# Patient Record
Sex: Female | Born: 1950 | Race: Black or African American | Hispanic: No | Marital: Single | Smoking: Former smoker
Health system: Southern US, Community
[De-identification: ages and names within clinical notes are randomized; demographics above are authoritative.]

## PROBLEM LIST (undated history)

## (undated) DIAGNOSIS — T884XXA Failed or difficult intubation, initial encounter: Secondary | ICD-10-CM

## (undated) DIAGNOSIS — F32A Depression, unspecified: Secondary | ICD-10-CM

## (undated) DIAGNOSIS — J40 Bronchitis, not specified as acute or chronic: Secondary | ICD-10-CM

## (undated) DIAGNOSIS — T7840XA Allergy, unspecified, initial encounter: Secondary | ICD-10-CM

## (undated) DIAGNOSIS — F419 Anxiety disorder, unspecified: Secondary | ICD-10-CM

## (undated) DIAGNOSIS — R0600 Dyspnea, unspecified: Secondary | ICD-10-CM

## (undated) DIAGNOSIS — R011 Cardiac murmur, unspecified: Secondary | ICD-10-CM

## (undated) DIAGNOSIS — J449 Chronic obstructive pulmonary disease, unspecified: Secondary | ICD-10-CM

## (undated) DIAGNOSIS — T753XXA Motion sickness, initial encounter: Secondary | ICD-10-CM

## (undated) DIAGNOSIS — I10 Essential (primary) hypertension: Secondary | ICD-10-CM

## (undated) DIAGNOSIS — H269 Unspecified cataract: Secondary | ICD-10-CM

## (undated) DIAGNOSIS — D649 Anemia, unspecified: Secondary | ICD-10-CM

## (undated) DIAGNOSIS — F410 Panic disorder [episodic paroxysmal anxiety] without agoraphobia: Secondary | ICD-10-CM

## (undated) DIAGNOSIS — Z5189 Encounter for other specified aftercare: Secondary | ICD-10-CM

## (undated) DIAGNOSIS — Z8719 Personal history of other diseases of the digestive system: Secondary | ICD-10-CM

## (undated) DIAGNOSIS — I350 Nonrheumatic aortic (valve) stenosis: Secondary | ICD-10-CM

## (undated) DIAGNOSIS — Z8489 Family history of other specified conditions: Secondary | ICD-10-CM

## (undated) HISTORY — DX: Unspecified cataract: H26.9

## (undated) HISTORY — DX: Bronchitis, not specified as acute or chronic: J40

## (undated) HISTORY — PX: EYE SURGERY: SHX253

## (undated) HISTORY — DX: Encounter for other specified aftercare: Z51.89

## (undated) HISTORY — PX: HERNIA REPAIR: SHX51

## (undated) HISTORY — DX: Anemia, unspecified: D64.9

## (undated) HISTORY — DX: Essential (primary) hypertension: I10

## (undated) HISTORY — PX: TONSILLECTOMY: SUR1361

## (undated) HISTORY — DX: Allergy, unspecified, initial encounter: T78.40XA

## (undated) HISTORY — DX: Depression, unspecified: F32.A

---

## 2004-08-27 ENCOUNTER — Ambulatory Visit: Payer: Self-pay | Admitting: Internal Medicine

## 2005-12-11 ENCOUNTER — Ambulatory Visit: Payer: Self-pay | Admitting: General Surgery

## 2006-03-24 ENCOUNTER — Ambulatory Visit: Payer: Self-pay | Admitting: Internal Medicine

## 2010-12-31 ENCOUNTER — Ambulatory Visit: Payer: Self-pay

## 2011-11-10 ENCOUNTER — Ambulatory Visit: Payer: Self-pay | Admitting: Emergency Medicine

## 2012-12-01 ENCOUNTER — Ambulatory Visit: Payer: Self-pay | Admitting: Internal Medicine

## 2013-11-02 DIAGNOSIS — J40 Bronchitis, not specified as acute or chronic: Secondary | ICD-10-CM | POA: Insufficient documentation

## 2013-11-02 DIAGNOSIS — J42 Unspecified chronic bronchitis: Secondary | ICD-10-CM | POA: Insufficient documentation

## 2014-09-04 ENCOUNTER — Other Ambulatory Visit: Payer: Self-pay | Admitting: Internal Medicine

## 2014-09-04 DIAGNOSIS — Z1231 Encounter for screening mammogram for malignant neoplasm of breast: Secondary | ICD-10-CM

## 2014-09-25 ENCOUNTER — Ambulatory Visit
Admission: RE | Admit: 2014-09-25 | Discharge: 2014-09-25 | Disposition: A | Discharge status: Home / Self Care | Payer: 59 | Source: Ambulatory Visit | Attending: Internal Medicine | Admitting: Internal Medicine

## 2014-09-25 DIAGNOSIS — Z1231 Encounter for screening mammogram for malignant neoplasm of breast: Secondary | ICD-10-CM | POA: Insufficient documentation

## 2016-02-25 ENCOUNTER — Other Ambulatory Visit: Payer: Self-pay | Admitting: Internal Medicine

## 2016-02-25 DIAGNOSIS — Z1231 Encounter for screening mammogram for malignant neoplasm of breast: Secondary | ICD-10-CM

## 2016-03-31 ENCOUNTER — Ambulatory Visit
Admission: RE | Admit: 2016-03-31 | Discharge: 2016-03-31 | Disposition: A | Discharge status: Home / Self Care | Payer: 59 | Source: Ambulatory Visit | Attending: Internal Medicine | Admitting: Internal Medicine

## 2016-03-31 DIAGNOSIS — Z1231 Encounter for screening mammogram for malignant neoplasm of breast: Secondary | ICD-10-CM | POA: Diagnosis not present

## 2016-08-13 ENCOUNTER — Ambulatory Visit
Admission: RE | Admit: 2016-08-13 | Discharge: 2016-08-13 | Disposition: A | Discharge status: Home / Self Care | Payer: 59 | Source: Ambulatory Visit | Attending: Internal Medicine | Admitting: Internal Medicine

## 2016-08-13 ENCOUNTER — Other Ambulatory Visit: Payer: Self-pay | Admitting: Internal Medicine

## 2016-08-13 DIAGNOSIS — R0600 Dyspnea, unspecified: Secondary | ICD-10-CM | POA: Insufficient documentation

## 2016-08-25 ENCOUNTER — Ambulatory Visit: Payer: Self-pay | Admitting: Surgery

## 2016-09-03 ENCOUNTER — Ambulatory Visit (INDEPENDENT_AMBULATORY_CARE_PROVIDER_SITE_OTHER): Payer: 59 | Admitting: Surgery

## 2016-09-03 ENCOUNTER — Other Ambulatory Visit: Payer: Self-pay

## 2016-09-03 ENCOUNTER — Telehealth: Payer: Self-pay

## 2016-09-03 ENCOUNTER — Encounter: Payer: Self-pay | Admitting: Surgery

## 2016-09-03 ENCOUNTER — Telehealth: Payer: Self-pay | Admitting: Gastroenterology

## 2016-09-03 VITALS — BP 144/87 | HR 106 | Temp 98.3°F | Ht 63.0 in | Wt 189.4 lb

## 2016-09-03 DIAGNOSIS — K449 Diaphragmatic hernia without obstruction or gangrene: Secondary | ICD-10-CM

## 2016-09-03 NOTE — Patient Instructions (Signed)
We will schedule the following test for you and call you later today with the appointment times and dates.  We will send the referral to Gastroenterologist and someone from their office will call you to schedule an appointment to have Esophagogastroduodenoscopy.   The test that we will be scheduling are listed below.  CT Chest, CT abdomen and Pelvis, also  Pulmonary Function Test with DLCO.    We will also schedule a follow up appointment for you to discuss all of the test results with DR.Pabon after I have the dates and times for the above test.   Please call our office if you have any questions or concerns.

## 2016-09-03 NOTE — Telephone Encounter (Signed)
522/18-@ 8:15-PFT's -no caffeine, no inhalers the morning of.  Lab work- CBC, CMP- prior to having CT scans.  09/12/16-@ 11:30- [facility]- CT Chest Abdomin and Pelvis w/contrast- NPO 4 hours prior to test.  09/18/16-Dr.Wohl-nurse Kenney Houseman will send all paperwork to patient.  09/25/16-Dr.Pabon@ 10:15- [place] office. Discussed all the above with patient and patient verbalized understanding.  Appointment reminders mailed also.

## 2016-09-03 NOTE — Telephone Encounter (Signed)
Medical and Pulmonary Clearance faxed to Dr.Dennie Hall Busing at this time.

## 2016-09-03 NOTE — Progress Notes (Signed)
Surgical Consultation  09/03/2016  Yvette Jackson is an 66 y.o. female.   Chief Complaint  Patient presents with  . New Patient (Initial Visit)    Possible Hiatal Hernia referred by [facility]     HPI: 66 yo seen in consultation at the request of Dr. Hall Busing for Hiatal hernia. She reports that over the last couple months she has been having some dyspnea on exertion and cough. 2 months ago she was diagnosed with a pneumonia and was given some antibiotics. Since then she continues to have some dyspnea on exertion. At that time and chest x-ray was performed which I personally reviewed showing evidence of a hiatal hernia moderate size. Obviously this is limited given the fact that his only a plain chest x-ray without any contrast. She denies any dysphagia and no acid reflux symptoms. Apparently a few months ago she did have some reflux but the symptoms subsided after she quit smoking. She does have a history of COPD and she is an ex-smoker. She is able to tolerate a regular diet and have normal bowel movements. No fevers or chills.  Labs as follows Na 140, K 4.2, BUN 13, Cr 0.75 AST 15, ALT 10 WBC 3.7, Hgb 15.1, plt 154      Past Medical History:  Diagnosis Date  . Bronchitis     Past Surgical History:  Procedure Laterality Date  . CESAREAN SECTION      Family History  Problem Relation Age of Onset  . Diabetes Mother   . Heart disease Mother   . COPD Father     Social History:  reports that she quit smoking about 3 months ago. Her smoking use included Cigarettes. She has a 20.00 pack-year smoking history. She has never used smokeless tobacco. She reports that she does not drink alcohol or use drugs.  Allergies:  Allergies  Allergen Reactions  . Acetaminophen Hives    Pt takes Advil at home for pain  . Codeine Rash  . Sulfa Antibiotics Swelling    Per pt takes Advil at home.     Medications reviewed.     ROS Full ROS performed and is otherwise negative  other than what is stated in the HPI    BP (!) 144/87   Pulse (!) 106   Temp 98.3 F (36.8 C) (Oral)   Ht 5\' 3"  (1.6 m)   Wt 85.9 kg (189 lb 6.4 oz)   BMI 33.55 kg/m   Physical Exam  Constitutional: She is oriented to person, place, and time and well-developed, well-nourished, and in no distress. No distress.  HENT:  Head: Normocephalic and atraumatic.  Eyes: EOM are normal. Right eye exhibits no discharge. Left eye exhibits no discharge. No scleral icterus.  Neck: Normal range of motion. Neck supple. No JVD present. No tracheal deviation present. No thyromegaly present.  Cardiovascular: Normal rate, regular rhythm, normal heart sounds and intact distal pulses.   No murmur heard. Pulmonary/Chest: Effort normal and breath sounds normal. No stridor. No respiratory distress. She has no wheezes. She has no rales. She exhibits no tenderness.  Abdominal: Soft. She exhibits no distension and no mass. There is no tenderness. There is no rebound and no guarding.  Previous lower midline laparotomy scar  Musculoskeletal: Normal range of motion. She exhibits no edema or deformity.  Lymphadenopathy:    She has no cervical adenopathy.  Neurological: She is alert and oriented to person, place, and time. No cranial nerve deficit. Gait normal. GCS score is  15.  Skin: Skin is warm and dry. She is not diaphoretic.  Psychiatric: Mood, memory, affect and judgment normal.  Nursing note and vitals reviewed.    No results found for this or any previous visit (from the past 48 hour(s)). No results found.  Assessment/Plan: 66 year old female with a history of COPD and  dyspnea on exertion as well as a hiatal hernia found on a chest x-ray. Difficult to establish to what degree the hernia is contributing to her pulmonary symptoms. I will order PFTs with DLCO as well as a CT scan of the chest abdomen and pelvis with by mouth and IV contrast to delineate her mediastinal anatomy as well as in her  intra-abdominal anatomy. I will also request a GI consultation for an EGD so we can evaluate the esophagus and the stomach as well as the hiatal hernia. We will also get some baseline labs including a CMP and a CBC and will follow up in a few weeks once the studies are completed. Scars with the patient in detail about her disease process. No need for emergent surgical revision at this time.   Caroleen Hamman, MD [facility] General Surgeon

## 2016-09-03 NOTE — Telephone Encounter (Signed)
09/03/16 [employer] website approval for EGD 61683 / K44.9 Approval #: [id number].

## 2016-09-03 NOTE — Addendum Note (Signed)
Addended by: Celene Kras on: 09/03/2016 03:30 PM   Modules accepted: Orders

## 2016-09-04 ENCOUNTER — Telehealth: Payer: Self-pay

## 2016-09-04 NOTE — Telephone Encounter (Signed)
I have put in an internal referral to [facility].   I will follow up within 3-5 days to make sure the appointments have been scheduled.

## 2016-09-08 ENCOUNTER — Telehealth: Payer: Self-pay

## 2016-09-08 ENCOUNTER — Encounter: Payer: Self-pay | Admitting: Gastroenterology

## 2016-09-08 NOTE — Telephone Encounter (Signed)
Patient is scheduled for an EGD on 09/18/16

## 2016-09-08 NOTE — Telephone Encounter (Signed)
Spoke with Yvette Jackson at Dr.Denny Tate;s office to check Medical Clearance status. Dr.Tate is waiting to see the test results from Ct scan, PFT's that Dr.Pabon has ordered before completing clearance form.

## 2016-09-09 ENCOUNTER — Ambulatory Visit: Payer: 59 | Attending: Surgery

## 2016-09-09 DIAGNOSIS — K449 Diaphragmatic hernia without obstruction or gangrene: Secondary | ICD-10-CM | POA: Insufficient documentation

## 2016-09-09 LAB — BLOOD GAS, VENOUS
ACID-BASE EXCESS: 4.7 mmol/L — AB (ref 0.0–2.0)
Bicarbonate: 31.8 mmol/L — ABNORMAL HIGH (ref 20.0–28.0)
FIO2: 0.21
PH VEN: 7.34 (ref 7.250–7.430)
Patient temperature: 37
pCO2, Ven: 59 mmHg (ref 44.0–60.0)

## 2016-09-09 MED ORDER — ALBUTEROL SULFATE (2.5 MG/3ML) 0.083% IN NEBU
2.5000 mg | INHALATION_SOLUTION | Freq: Once | RESPIRATORY_TRACT | Status: AC
  Administered 2016-09-09: 2.5 mg via RESPIRATORY_TRACT
  Filled 2016-09-09: qty 3

## 2016-09-11 ENCOUNTER — Other Ambulatory Visit
Admission: RE | Admit: 2016-09-11 | Discharge: 2016-09-11 | Disposition: A | Discharge status: Home / Self Care | Payer: 59 | Source: Ambulatory Visit | Attending: Surgery | Admitting: Surgery

## 2016-09-11 ENCOUNTER — Encounter: Payer: Self-pay | Admitting: *Deleted

## 2016-09-11 ENCOUNTER — Other Ambulatory Visit: Payer: Self-pay

## 2016-09-11 DIAGNOSIS — K449 Diaphragmatic hernia without obstruction or gangrene: Secondary | ICD-10-CM

## 2016-09-11 LAB — COMPREHENSIVE METABOLIC PANEL
ALBUMIN: 3.6 g/dL (ref 3.5–5.0)
ALT: 19 U/L (ref 14–54)
AST: 24 U/L (ref 15–41)
Alkaline Phosphatase: 93 U/L (ref 38–126)
Anion gap: 5 (ref 5–15)
BILIRUBIN TOTAL: 0.3 mg/dL (ref 0.3–1.2)
BUN: 11 mg/dL (ref 6–20)
CO2: 29 mmol/L (ref 22–32)
CREATININE: 0.97 mg/dL (ref 0.44–1.00)
Calcium: 8.8 mg/dL — ABNORMAL LOW (ref 8.9–10.3)
Chloride: 106 mmol/L (ref 101–111)
GFR calc Af Amer: >60 mL/min (ref >60)
GLUCOSE: 108 mg/dL — AB (ref 65–99)
POTASSIUM: 3.8 mmol/L (ref 3.5–5.1)
Sodium: 140 mmol/L (ref 135–145)
TOTAL PROTEIN: 7.7 g/dL (ref 6.5–8.1)

## 2016-09-11 LAB — CBC WITH DIFFERENTIAL/PLATELET
BASOS ABS: 0 10*3/uL (ref 0–0.1)
BASOS PCT: 1 %
Eosinophils Absolute: 0.1 10*3/uL (ref 0–0.7)
Eosinophils Relative: 2 %
HEMATOCRIT: 33 % — AB (ref 35.0–47.0)
HEMOGLOBIN: 11.3 g/dL — AB (ref 12.0–16.0)
Lymphocytes Relative: 23 %
Lymphs Abs: 1 10*3/uL (ref 1.0–3.6)
MCH: 30.9 pg (ref 26.0–34.0)
MCHC: 34 g/dL (ref 32.0–36.0)
MCV: 90.7 fL (ref 80.0–100.0)
MONO ABS: 0.5 10*3/uL (ref 0.2–0.9)
Monocytes Relative: 11 %
NEUTROS ABS: 2.6 10*3/uL (ref 1.4–6.5)
NEUTROS PCT: 63 %
Platelets: 244 10*3/uL (ref 150–440)
RBC: 3.65 MIL/uL — AB (ref 3.80–5.20)
RDW: 16.6 % — AB (ref 11.5–14.5)
WBC: 4.2 10*3/uL (ref 3.6–11.0)

## 2016-09-12 ENCOUNTER — Ambulatory Visit
Admission: RE | Admit: 2016-09-12 | Discharge: 2016-09-12 | Disposition: A | Discharge status: Home / Self Care | Payer: 59 | Source: Ambulatory Visit | Attending: Surgery | Admitting: Surgery

## 2016-09-12 DIAGNOSIS — K5730 Diverticulosis of large intestine without perforation or abscess without bleeding: Secondary | ICD-10-CM | POA: Insufficient documentation

## 2016-09-12 DIAGNOSIS — K449 Diaphragmatic hernia without obstruction or gangrene: Secondary | ICD-10-CM | POA: Insufficient documentation

## 2016-09-12 DIAGNOSIS — K8020 Calculus of gallbladder without cholecystitis without obstruction: Secondary | ICD-10-CM | POA: Insufficient documentation

## 2016-09-12 MED ORDER — IOPAMIDOL (ISOVUE-300) INJECTION 61%
100.0000 mL | Freq: Once | INTRAVENOUS | Status: AC | PRN
Start: 2016-09-12 — End: 2016-09-12
  Administered 2016-09-12: 100 mL via INTRAVENOUS

## 2016-09-17 ENCOUNTER — Telehealth: Payer: Self-pay

## 2016-09-17 DIAGNOSIS — K449 Diaphragmatic hernia without obstruction or gangrene: Secondary | ICD-10-CM

## 2016-09-17 NOTE — Discharge Instructions (Signed)

## 2016-09-17 NOTE — Telephone Encounter (Signed)
Patient notified of Barium Swallow 09/22/16 @ 8:30 am [facility].

## 2016-09-17 NOTE — Telephone Encounter (Signed)
Per Dr.Pabon-  Orders placed for Barium Swallow -09/22/16 @ [place] @ 8:30.  Nothing by mouth 3 hours prior.  Left message for patient to call office regarding the above.

## 2016-09-18 ENCOUNTER — Ambulatory Visit: Payer: 59 | Admitting: Anesthesiology

## 2016-09-18 ENCOUNTER — Ambulatory Visit
Admission: RE | Admit: 2016-09-18 | Discharge: 2016-09-18 | Disposition: A | Discharge status: Home / Self Care | Payer: 59 | Source: Ambulatory Visit | Attending: Gastroenterology | Admitting: Gastroenterology

## 2016-09-18 ENCOUNTER — Encounter
Admission: RE | Disposition: A | Discharge status: Home / Self Care | Payer: Self-pay | Source: Ambulatory Visit | Attending: Gastroenterology

## 2016-09-18 DIAGNOSIS — K449 Diaphragmatic hernia without obstruction or gangrene: Secondary | ICD-10-CM

## 2016-09-18 DIAGNOSIS — R1013 Epigastric pain: Secondary | ICD-10-CM

## 2016-09-18 DIAGNOSIS — Z7982 Long term (current) use of aspirin: Secondary | ICD-10-CM | POA: Diagnosis not present

## 2016-09-18 DIAGNOSIS — Z87891 Personal history of nicotine dependence: Secondary | ICD-10-CM | POA: Diagnosis not present

## 2016-09-18 DIAGNOSIS — K228 Other specified diseases of esophagus: Secondary | ICD-10-CM | POA: Insufficient documentation

## 2016-09-18 DIAGNOSIS — Z79899 Other long term (current) drug therapy: Secondary | ICD-10-CM | POA: Insufficient documentation

## 2016-09-18 HISTORY — DX: Family history of other specified conditions: Z84.89

## 2016-09-18 HISTORY — DX: Personal history of other diseases of the digestive system: Z87.19

## 2016-09-18 HISTORY — PX: ESOPHAGOGASTRODUODENOSCOPY (EGD) WITH PROPOFOL: SHX5813

## 2016-09-18 HISTORY — DX: Dyspnea, unspecified: R06.00

## 2016-09-18 HISTORY — DX: Motion sickness, initial encounter: T75.3XXA

## 2016-09-18 SURGERY — ESOPHAGOGASTRODUODENOSCOPY (EGD) WITH PROPOFOL
Anesthesia: Monitor Anesthesia Care

## 2016-09-18 MED ORDER — LIDOCAINE HCL (CARDIAC) 20 MG/ML IV SOLN
INTRAVENOUS | Status: DC | PRN
  Administered 2016-09-18: 50 mg via INTRAVENOUS

## 2016-09-18 MED ORDER — STERILE WATER FOR IRRIGATION IR SOLN
Status: DC | PRN
  Administered 2016-09-18: 10:00:00

## 2016-09-18 MED ORDER — PROPOFOL 10 MG/ML IV BOLUS
INTRAVENOUS | Status: DC | PRN
  Administered 2016-09-18: 100 mg via INTRAVENOUS
  Administered 2016-09-18 (×2): 50 mg via INTRAVENOUS

## 2016-09-18 MED ORDER — LACTATED RINGERS IV SOLN
INTRAVENOUS | Status: DC
  Administered 2016-09-18: 09:00:00 via INTRAVENOUS

## 2016-09-18 MED ORDER — SODIUM CHLORIDE 0.9 % IV SOLN: INTRAVENOUS | Status: DC

## 2016-09-18 MED ORDER — ONDANSETRON HCL 4 MG/2ML IJ SOLN: 4.0000 mg | Freq: Once | INTRAMUSCULAR | Status: DC | PRN

## 2016-09-18 MED ORDER — GLYCOPYRROLATE 0.2 MG/ML IJ SOLN
INTRAMUSCULAR | Status: DC | PRN
Start: 2016-09-18 — End: 2016-09-18
  Administered 2016-09-18: 0.2 mg via INTRAVENOUS

## 2016-09-18 SURGICAL SUPPLY — 32 items

## 2016-09-18 NOTE — Anesthesia Procedure Notes (Signed)
Procedure Name: MAC Performed by: Alisia Vanengen Pre-anesthesia Checklist: Patient identified, Emergency Drugs available, Suction available, Timeout performed and Patient being monitored Patient Re-evaluated:Patient Re-evaluated prior to inductionOxygen Delivery Method: Nasal cannula Placement Confirmation: positive ETCO2     

## 2016-09-18 NOTE — Op Note (Signed)
[facility] Gastroenterology Patient Name: Yvette Jackson Procedure Date: 09/18/2016 9:38 AM MRN: [id number] Account #: [id number] Date of Birth: May 01, 1950 Admit Type: Outpatient Age: 66 Room: [facility] OR ROOM 01 Gender: Female Note Status: Finalized Procedure:            Upper GI endoscopy Indications:          Epigastric abdominal pain Providers:            Lucilla Lame MD, MD Referring MD:         Leona Carry. Hall Busing, MD (Referring MD) Medicines:            Propofol per Anesthesia Complications:        No immediate complications. Procedure:            Pre-Anesthesia Assessment:                       - Prior to the procedure, a History and Physical was                        performed, and patient medications and allergies were                        reviewed. The patient's tolerance of previous                        anesthesia was also reviewed. The risks and benefits of                        the procedure and the sedation options and risks were                        discussed with the patient. All questions were                        answered, and informed consent was obtained. Prior                        Anticoagulants: The patient has taken no previous                        anticoagulant or antiplatelet agents. ASA Grade                        Assessment: II - A patient with mild systemic disease.                        After reviewing the risks and benefits, the patient was                        deemed in satisfactory condition to undergo the                        procedure.                       After obtaining informed consent, the endoscope was                        passed under direct vision. Throughout the procedure,  the patient's blood pressure, pulse, and oxygen                        saturations were monitored continuously. The Olympus                        GIF-HQ190 Endoscope (S#. [id number]) was introduced   through the mouth, and advanced to the second part of                        duodenum. The upper GI endoscopy was accomplished                        without difficulty. The patient tolerated the procedure                        well. Findings:      There were esophageal mucosal changes suspicious for long-segment       Barrett's esophagus present in the lower third of the esophagus. Mucosa       was biopsied with a cold forceps for histology randomly in the lower       third of the esophagus. One specimen bottle was sent to pathology.      A 12 cm hiatal hernia was present.      The entire examined stomach was normal.      The examined duodenum was normal. Impression:           - Esophageal mucosal changes suspicious for                        long-segment Barrett's esophagus. Biopsied.                       - 10 cm hiatal hernia.                       - Normal stomach.                       - Normal examined duodenum.                       - The majority of the stomach is in the chest. Recommendation:       - Discharge patient to home.                       - Resume previous diet.                       - Continue present medications.                       - Await pathology results.                       - Return to Dr. Dahlia Byes. Procedure Code(s):    --- Professional ---                       [phone], Esophagogastroduodenoscopy, flexible, transoral;                        with biopsy, single or multiple Diagnosis Code(s):    --- Professional ---  R10.13, Epigastric pain                       K22.8, Other specified diseases of esophagus                       K44.9, Diaphragmatic hernia without obstruction or                        gangrene CPT copyright 2016 American Medical Association. All rights reserved. The codes documented in this report are preliminary and upon coder review may  be revised to meet current compliance requirements. Lucilla Lame MD, MD 09/18/2016  9:53:41 AM This report has been signed electronically. Number of Addenda: 0 Note Initiated On: 09/18/2016 9:38 AM      [facility]

## 2016-09-18 NOTE — Anesthesia Preprocedure Evaluation (Signed)
Anesthesia Evaluation  Patient identified by MRN, date of birth, ID band Patient awake    Reviewed: Allergy & Precautions, NPO status , Patient's Chart, lab work & pertinent test results  Airway Mallampati: II  TM Distance: >3 FB Neck ROM: full    Dental   Pulmonary shortness of breath, former smoker,    Pulmonary exam normal        Cardiovascular Normal cardiovascular exam     Neuro/Psych    GI/Hepatic hiatal hernia,   Endo/Other    Renal/GU      Musculoskeletal   Abdominal   Peds  Hematology   Anesthesia Other Findings   Reproductive/Obstetrics                             Anesthesia Physical Anesthesia Plan  ASA: II  Anesthesia Plan: MAC   Post-op Pain Management:    Induction:   Airway Management Planned:   Additional Equipment:   Intra-op Plan:   Post-operative Plan:   Informed Consent: I have reviewed the patients History and Physical, chart, labs and discussed the procedure including the risks, benefits and alternatives for the proposed anesthesia with the patient or authorized representative who has indicated his/her understanding and acceptance.     Plan Discussed with: CRNA  Anesthesia Plan Comments:         Anesthesia Quick Evaluation

## 2016-09-18 NOTE — H&P (Signed)
Lucilla Lame, MD [address] [place],  [address] Phone:[phone] Fax : [phone]  Primary Care Physician:  Albina Billet, MD Primary Gastroenterologist:  Dr. Allen Norris  Pre-Procedure History & Physical: HPI:  Yvette Jackson is a 66 y.o. female is here for an endoscopy.   Past Medical History:  Diagnosis Date  . Bronchitis   . Dyspnea   . Family history of adverse reaction to anesthesia    Sister "died from anesthesia" many years ago during thyroid surgery  . History of hiatal hernia   . Motion sickness    cars    Past Surgical History:  Procedure Laterality Date  . CESAREAN SECTION    . TONSILLECTOMY      Prior to Admission medications   Medication Sig Start Date End Date Taking? Authorizing Provider  albuterol (PROVENTIL HFA;VENTOLIN HFA) 108 (90 Base) MCG/ACT inhaler Inhale into the lungs every 6 (six) hours as needed for wheezing or shortness of breath.   Yes [provider]  aspirin EC 81 MG tablet Take 81 mg by mouth daily.   Yes [provider]  Budesonide-Formoterol Fumarate (SYMBICORT IN) Inhale into the lungs 2 (two) times daily.   Yes [provider]  escitalopram (LEXAPRO) 20 MG tablet  08/18/16  Yes [provider]  fexofenadine (ALLEGRA) 180 MG tablet Take 180 mg by mouth daily as needed for allergies or rhinitis.   Yes [provider]  meclizine (ANTIVERT) 25 MG tablet TAKE 1 TABLET BY MOUTH EVERY 8 HOURS AS NEEDED FOR DIZZINESS 07/16/16  Yes [provider]  LORazepam (ATIVAN) 0.5 MG tablet Take 0.5 mg by mouth every 6 (six) hours as needed.    [provider]    Allergies as of 09/03/2016 - Review Complete 09/03/2016  Allergen Reaction Noted  . Acetaminophen Hives 11/02/2013  . Codeine Rash 11/02/2013  . Sulfa antibiotics Swelling 11/02/2013    Family History  Problem Relation Age of Onset  . Diabetes Mother   . Heart disease Mother   . COPD Father     Social  History   Social History  . Marital status: Divorced    Spouse name: N/A  . Number of children: N/A  . Years of education: N/A   Occupational History  . Not on file.   Social History Main Topics  . Smoking status: Former Smoker    Packs/day: 1.00    Years: 40.00    Types: Cigarettes    Quit date: 05/06/2016  . Smokeless tobacco: Never Used  . Alcohol use No  . Drug use: No  . Sexual activity: No   Other Topics Concern  . Not on file   Social History Narrative  . No narrative on file    Review of Systems: See HPI, otherwise negative ROS  Physical Exam: BP (!) 139/95   Pulse 82   Temp 97.8 F (36.6 C) (Temporal)   Ht 5\' 3"  (1.6 m)   Wt 192 lb (87.1 kg)   SpO2 98%   BMI 34.01 kg/m  General:   Alert,  pleasant and cooperative in NAD Head:  Normocephalic and atraumatic. Neck:  Supple; no masses or thyromegaly. Lungs:  Clear throughout to auscultation.    Heart:  Regular rate and rhythm. Abdomen:  Soft, nontender and nondistended. Normal bowel sounds, without guarding, and without rebound.   Neurologic:  Alert and  oriented x4;  grossly normal neurologically.  Impression/Plan: Yvette Jackson is here for an endoscopy to be  performed for epigastric pain and shortness or breath.  Risks, benefits, limitations, and alternatives regarding  endoscopy have been reviewed with the patient.  Questions have been answered.  All parties agreeable.   Lucilla Lame, MD  09/18/2016, 8:38 AM

## 2016-09-18 NOTE — Transfer of Care (Signed)
Immediate Anesthesia Transfer of Care Note  Patient: Yvette Jackson  Procedure(s) Performed: Procedure(s): ESOPHAGOGASTRODUODENOSCOPY (EGD) WITH PROPOFOL (N/A)  Patient Location: PACU  Anesthesia Type: MAC  Level of Consciousness: awake, alert  and patient cooperative  Airway and Oxygen Therapy: Patient Spontanous Breathing and Patient connected to supplemental oxygen  Post-op Assessment: Post-op Vital signs reviewed, Patient's Cardiovascular Status Stable, Respiratory Function Stable, Patent Airway and No signs of Nausea or vomiting  Post-op Vital Signs: Reviewed and stable  Complications: No apparent anesthesia complications

## 2016-09-18 NOTE — Anesthesia Postprocedure Evaluation (Signed)
Anesthesia Post Note  Patient: Yvette Jackson  Procedure(s) Performed: Procedure(s) (LRB): ESOPHAGOGASTRODUODENOSCOPY (EGD) WITH PROPOFOL (N/A)  Patient location during evaluation: PACU Anesthesia Type: MAC Level of consciousness: awake and alert Pain management: pain level controlled Vital Signs Assessment: post-procedure vital signs reviewed and stable Respiratory status: spontaneous breathing, nonlabored ventilation, respiratory function stable and patient connected to nasal cannula oxygen Cardiovascular status: stable and blood pressure returned to baseline Anesthetic complications: no    Amaryllis Dyke

## 2016-09-19 ENCOUNTER — Encounter: Payer: Self-pay | Admitting: Gastroenterology

## 2016-09-22 ENCOUNTER — Ambulatory Visit
Admission: RE | Admit: 2016-09-22 | Discharge: 2016-09-22 | Disposition: A | Discharge status: Home / Self Care | Payer: 59 | Source: Ambulatory Visit | Attending: Surgery | Admitting: Surgery

## 2016-09-22 DIAGNOSIS — K3189 Other diseases of stomach and duodenum: Secondary | ICD-10-CM | POA: Insufficient documentation

## 2016-09-22 DIAGNOSIS — K449 Diaphragmatic hernia without obstruction or gangrene: Secondary | ICD-10-CM | POA: Diagnosis present

## 2016-09-23 ENCOUNTER — Telehealth: Payer: Self-pay

## 2016-09-23 NOTE — Telephone Encounter (Signed)
Spoke with Olene Floss at this time regarding Medical /Pulmonary Clearances. She stated she send note to nurse.

## 2016-09-24 ENCOUNTER — Telehealth: Payer: Self-pay

## 2016-09-24 NOTE — Telephone Encounter (Signed)
Left vm for pt to return my call regarding EGD results.

## 2016-09-24 NOTE — Telephone Encounter (Signed)
Spoke with Caren Griffins at [place] office regarding Pulmonary Clearance and she said she would put it on his desk for him to sign off on.

## 2016-09-24 NOTE — Telephone Encounter (Signed)
Medical Clearance obtained at this time from Dr.Tate and scanned under Media.

## 2016-09-24 NOTE — Telephone Encounter (Signed)
-----   Message from Lucilla Lame, MD sent at 09/23/2016  1:02 PM EDT ----- The patient know that her biopsies of her esophagus did not show any Barrett's esophagus cancerous or precancerous conditions.

## 2016-09-24 NOTE — Telephone Encounter (Signed)
Spoke with Ivin Booty at [facility] office regarding Clearances. Ivin Booty stated she talked with Dr.tate and told him we needed this faxed over today. She stated she would fax it over as soon as it is completed.

## 2016-09-25 ENCOUNTER — Encounter: Payer: Self-pay | Admitting: Surgery

## 2016-09-25 ENCOUNTER — Ambulatory Visit (INDEPENDENT_AMBULATORY_CARE_PROVIDER_SITE_OTHER): Payer: 59 | Admitting: Surgery

## 2016-09-25 ENCOUNTER — Ambulatory Visit: Payer: 59 | Admitting: Surgery

## 2016-09-25 VITALS — BP 127/85 | HR 78 | Temp 98.4°F | Ht 63.0 in | Wt 194.6 lb

## 2016-09-25 DIAGNOSIS — K449 Diaphragmatic hernia without obstruction or gangrene: Secondary | ICD-10-CM

## 2016-09-25 NOTE — Patient Instructions (Addendum)
We have scheduled you a Pulmonary Medical Clearance appointment with [facility] on 6/20 at 11:00 AM.  Once this medical clearance is received we will then schedule your Hiatal Hernia Repair Surgery.  Please call our office if you have any questions or concerns.

## 2016-09-25 NOTE — Progress Notes (Signed)
Surgical Consultation  09/25/2016  Yvette Jackson is an 66 y.o. female.   Chief Complaint  Patient presents with  . Follow-up    Hiatal Hernia. Discuss CT, PFT's, Lab work, EGD, and Barrium Swallow     HPI:  Yvette Jackson is a 66 year old female following now for a large hiatal hernia. She has completed her workup including barium swallow that I personally reviewed there is evidence of an obvious large hiatal hernia but there is good peristalsis within the esophagus. There is no masses. She also had an EGD by Dr. wall showing a large hiatal hernia with some areas changes. Pathology from this show some gastric mucosa. No evidence of dysplasia. Finally he her CT of the chest and abdomen was personally reviewed and there is evidence of a large hiatal hernia with a possible bullae component and. She clinically is feeling well but she still has some dyspnea on exertion. She is still able to tolerate by mouth and does not have dysphagia at this time PFTs showed moderate obstructive airway disease responsive to bronchodilators She quit smoking 3 months ago  Past Medical History:  Diagnosis Date  . Bronchitis   . Dyspnea   . Family history of adverse reaction to anesthesia    Sister "died from anesthesia" many years ago during thyroid surgery  . History of hiatal hernia   . Motion sickness    cars    Past Surgical History:  Procedure Laterality Date  . CESAREAN SECTION    . ESOPHAGOGASTRODUODENOSCOPY (EGD) WITH PROPOFOL N/A 09/18/2016   Procedure: ESOPHAGOGASTRODUODENOSCOPY (EGD) WITH PROPOFOL;  Surgeon: Lucilla Lame, MD;  Location: [facility];  Service: Endoscopy;  Laterality: N/A;  . TONSILLECTOMY      Family History  Problem Relation Age of Onset  . Diabetes Mother   . Heart disease Mother   . COPD Father     Social History:  reports that she quit smoking about 4 months ago. Her smoking use included Cigarettes. She has a 40.00 pack-year smoking history. She has never used  smokeless tobacco. She reports that she does not drink alcohol or use drugs.  Allergies:  Allergies  Allergen Reactions  . Acetaminophen Hives    Pt takes Advil at home for pain  . Codeine Rash  . Sulfa Antibiotics Swelling    Per pt takes Advil at home.     Medications reviewed.     ROS Full ROS performed and is otherwise negative other than what is stated in the HPI    BP 127/85   Pulse 78   Temp 98.4 F (36.9 C) (Oral)   Ht 5\' 3"  (1.6 m)   Wt 88.3 kg (194 lb 9.6 oz)   BMI 34.47 kg/m    Physical Exam  Constitutional: She is oriented to person, place, and time and well-developed, well-nourished, and in no distress. No distress.  Eyes: No scleral icterus.  Neck: Neck supple. No JVD present. No tracheal deviation present. No thyromegaly present.  Cardiovascular: Normal rate and regular rhythm.   Pulmonary/Chest: Effort normal. No respiratory distress. She has no wheezes. She has no rales. She exhibits no tenderness.  Abdominal: Bowel sounds are normal. She exhibits no distension. There is no tenderness. There is no rebound and no guarding.  Lower midline laparotomy scar  Musculoskeletal: Normal range of motion. She exhibits no edema.  Neurological: She is alert and oriented to person, place, and time. Gait normal. GCS score is 15.  Skin: Skin is warm and dry. She is  not diaphoretic.  Psychiatric: Mood, memory, affect and judgment normal.  Nursing note and vitals reviewed.  Assessment/Plan: Large hiatal hernia that might be causing some of pulmonary symptoms. He is also some concern for potential volvulus or incarceration of this hernia. I given the above findings I do recommend surgical repair. Unfortunate she does have some dyspnea on exertion and I will request a pulmonary consult before doing the final procedure. We discussed in detail about the surgery laparoscopic had a hernia repair with mesh versus open repair. I do think that there is a good chance that we can do  it laparoscopically but certainly an open approach is possible. When she had clearance from pulmonary we can go ahead and schedule her for laparoscopic repair possible open. Extensive counseling provided.. I spent greater than 40 minutes in this encounter with more than 50% of time spent in coordinating/counseling her care.  Caroleen Hamman, MD [facility] General Surgeon

## 2016-09-26 NOTE — Telephone Encounter (Signed)
Pt notified of EGD results.  

## 2016-09-29 ENCOUNTER — Telehealth: Payer: Self-pay

## 2016-09-29 NOTE — Telephone Encounter (Signed)
Pulmonary Clearance faxed to Dr.Fleming at this time.

## 2016-09-30 ENCOUNTER — Telehealth: Payer: Self-pay

## 2016-09-30 NOTE — Telephone Encounter (Signed)
Patient has an appointment with pulmonologist on 10/08/16.  Will check back after appointment.

## 2016-10-08 ENCOUNTER — Telehealth: Payer: Self-pay

## 2016-10-08 NOTE — Telephone Encounter (Signed)
Pulmonary Clearance obtained at this time from DR.Raul Del and will be scanned under Media.

## 2016-10-09 ENCOUNTER — Telehealth: Payer: Self-pay | Admitting: Surgery

## 2016-10-09 NOTE — Telephone Encounter (Signed)
I have called Pt to advised of pre op date/time and sx date. No answer. I have left a message on voicemail.  Sx: 10/21/16 with Dr Pabon--Laparoscopic hiatal hernia repair possible open.  Pre op: 10/14/16 @ 10:30 in office.   Patient made aware to call [phone], between 1-3:00pm the day before surgery, to find out what time to arrive.

## 2016-10-10 NOTE — Telephone Encounter (Signed)
Pt advised of pre op date/time and sx date. Sx: 10/21/16 with Dr Dahlia Byes with Dr Genevive Bi assisting--Laparoscopic hiatal hernia repair possible open.  Pre op: 10/14/16 @ 10:30am.   Patient made aware to call [phone], between 1-3:00pm the day before surgery, to find out what time to arrive.

## 2016-10-14 ENCOUNTER — Encounter
Admission: RE | Admit: 2016-10-14 | Discharge: 2016-10-14 | Disposition: A | Discharge status: Home / Self Care | Payer: 59 | Source: Ambulatory Visit | Attending: Surgery | Admitting: Surgery

## 2016-10-14 DIAGNOSIS — Z01812 Encounter for preprocedural laboratory examination: Secondary | ICD-10-CM | POA: Diagnosis present

## 2016-10-14 DIAGNOSIS — F419 Anxiety disorder, unspecified: Secondary | ICD-10-CM | POA: Insufficient documentation

## 2016-10-14 HISTORY — DX: Anxiety disorder, unspecified: F41.9

## 2016-10-14 HISTORY — DX: Panic disorder (episodic paroxysmal anxiety): F41.0

## 2016-10-14 NOTE — Patient Instructions (Signed)
  Your procedure is scheduled on: October 21, 2016 [facility]) Report to Same Day Surgery 2nd floor medical mall ([place] Entrance-take elevator on left to 2nd floor.  Check in with surgery information desk.) To find out your arrival time please call [phone] between 1PM - 3PM on October 20, 2016 (MONDAY)   Remember: Instructions that are not followed completely may result in serious medical risk, up to and including death, or upon the discretion of your surgeon and anesthesiologist your surgery may need to be rescheduled.    _x___ 1. Do not eat food or drink liquids after midnight. No gum chewing or hard candies                           .     __x__ 2. No Alcohol for 24 hours before or after surgery.   __x__3. No Smoking for 24 prior to surgery.   ____  4. Bring all medications with you on the day of surgery if instructed.    __x__ 5. Notify your doctor if there is any change in your medical condition     (cold, fever, infections).     Do not wear jewelry, make-up, hairpins, clips or nail polish.  Do not wear lotions, powders, or perfumes.  Do not shave 48 hours prior to surgery. Men may shave face and neck.  Do not bring valuables to the hospital.    [facility] is not responsible for any belongings or valuables.               Contacts, dentures or bridgework may not be worn into surgery.  Leave your suitcase in the car. After surgery it may be brought to your room.  For patients admitted to the hospital, discharge time is determined by your treatment team                      Patients discharged the day of surgery will not be allowed to drive home.  You will need someone to drive you home and stay with you the night of your procedure.    Please read over the following fact sheets that you were given:   [facility] Preparing for Surgery and or MRSA Information   _x___ Take the following medication with a sip of water the morning of surgery : 1. ESCITALOPRAM  (LEXAPRO)  ____Fleets enema or Magnesium Citrate as directed.   _x___ Use CHG Soap or sage wipes as directed on instruction sheet   _x___ Use inhalers on the day of surgery and bring to hospital day of surgery (USE ALBUTEROL AND SYMBICORT [place])  ____ Stop Metformin and Janumet 2 days prior to surgery.    ____ Take 1/2 of usual insulin dose the night before surgery and none on the morning surgery    _x___ Follow recommendations from Cardiologist, Pulmonologist or PCP regarding          stopping Aspirin, Coumadin, Plavix ,Eliquis, Effient, or Pradaxa, and Pletal. (STOP ASPIRIN NOW )  X____Stop Anti-inflammatories such as Advil, Aleve, Ibuprofen, Motrin, Naproxen, Naprosyn, Goodies powders or aspirin products. (STOP NOW )    _x___ Stop supplements until after surgery.  But may continue Vitamin D, Vitamin B, and multivitamin        ____ Bring C-Pap to the hospital.

## 2016-10-15 ENCOUNTER — Telehealth: Payer: Self-pay

## 2016-10-15 NOTE — Telephone Encounter (Signed)
[employer] forms were filled out and left at the front desk. Patient will need to pay her $25 fee.

## 2016-10-15 NOTE — Telephone Encounter (Signed)
Disability Forms were faxed in from [employer]. I have placed them on your desk.

## 2016-10-16 NOTE — Telephone Encounter (Signed)
Disability Paperwork has been received and a $25.00 collection fee has been obtained.  Paperwork has been faxed, confirmation has been received and scanned under media

## 2016-10-20 ENCOUNTER — Telehealth: Payer: Self-pay

## 2016-10-20 MED ORDER — SODIUM CHLORIDE 0.9 % IV SOLN
1.0000 g | INTRAVENOUS | Status: AC
  Administered 2016-10-21: 1 g via INTRAVENOUS
  Filled 2016-10-20: qty 1

## 2016-10-20 NOTE — Telephone Encounter (Signed)
Patient's disability form was filled out and faxed to [employer] with corrections.

## 2016-10-21 ENCOUNTER — Inpatient Hospital Stay: Payer: 59

## 2016-10-21 ENCOUNTER — Inpatient Hospital Stay
Admission: RE | Admit: 2016-10-21 | Discharge: 2016-10-28 | DRG: 327 | Disposition: A | Discharge status: Home / Self Care | Payer: 59 | Source: Ambulatory Visit | Attending: Surgery | Admitting: Surgery

## 2016-10-21 ENCOUNTER — Inpatient Hospital Stay: Payer: 59 | Admitting: Anesthesiology

## 2016-10-21 ENCOUNTER — Encounter: Payer: Self-pay | Admitting: *Deleted

## 2016-10-21 ENCOUNTER — Encounter
Admission: RE | Disposition: A | Discharge status: Home / Self Care | Payer: Self-pay | Source: Ambulatory Visit | Attending: Surgery

## 2016-10-21 DIAGNOSIS — I959 Hypotension, unspecified: Secondary | ICD-10-CM | POA: Diagnosis not present

## 2016-10-21 DIAGNOSIS — Z87891 Personal history of nicotine dependence: Secondary | ICD-10-CM | POA: Diagnosis not present

## 2016-10-21 DIAGNOSIS — S36113A Laceration of liver, unspecified degree, initial encounter: Secondary | ICD-10-CM | POA: Diagnosis not present

## 2016-10-21 DIAGNOSIS — F419 Anxiety disorder, unspecified: Secondary | ICD-10-CM | POA: Diagnosis present

## 2016-10-21 DIAGNOSIS — Z885 Allergy status to narcotic agent status: Secondary | ICD-10-CM | POA: Diagnosis not present

## 2016-10-21 DIAGNOSIS — Z825 Family history of asthma and other chronic lower respiratory diseases: Secondary | ICD-10-CM | POA: Diagnosis not present

## 2016-10-21 DIAGNOSIS — R042 Hemoptysis: Secondary | ICD-10-CM | POA: Diagnosis not present

## 2016-10-21 DIAGNOSIS — M1990 Unspecified osteoarthritis, unspecified site: Secondary | ICD-10-CM | POA: Diagnosis present

## 2016-10-21 DIAGNOSIS — E872 Acidosis: Secondary | ICD-10-CM | POA: Diagnosis not present

## 2016-10-21 DIAGNOSIS — R0902 Hypoxemia: Secondary | ICD-10-CM | POA: Diagnosis not present

## 2016-10-21 DIAGNOSIS — J449 Chronic obstructive pulmonary disease, unspecified: Secondary | ICD-10-CM | POA: Diagnosis present

## 2016-10-21 DIAGNOSIS — Z8719 Personal history of other diseases of the digestive system: Secondary | ICD-10-CM

## 2016-10-21 DIAGNOSIS — Z882 Allergy status to sulfonamides status: Secondary | ICD-10-CM | POA: Diagnosis not present

## 2016-10-21 DIAGNOSIS — Z5331 Laparoscopic surgical procedure converted to open procedure: Secondary | ICD-10-CM

## 2016-10-21 DIAGNOSIS — E8770 Fluid overload, unspecified: Secondary | ICD-10-CM | POA: Diagnosis not present

## 2016-10-21 DIAGNOSIS — J90 Pleural effusion, not elsewhere classified: Secondary | ICD-10-CM | POA: Diagnosis not present

## 2016-10-21 DIAGNOSIS — F410 Panic disorder [episodic paroxysmal anxiety] without agoraphobia: Secondary | ICD-10-CM | POA: Diagnosis present

## 2016-10-21 DIAGNOSIS — Z886 Allergy status to analgesic agent status: Secondary | ICD-10-CM | POA: Diagnosis not present

## 2016-10-21 DIAGNOSIS — D62 Acute posthemorrhagic anemia: Secondary | ICD-10-CM | POA: Diagnosis not present

## 2016-10-21 DIAGNOSIS — K449 Diaphragmatic hernia without obstruction or gangrene: Secondary | ICD-10-CM | POA: Diagnosis present

## 2016-10-21 DIAGNOSIS — R0602 Shortness of breath: Secondary | ICD-10-CM

## 2016-10-21 DIAGNOSIS — S36039A Unspecified laceration of spleen, initial encounter: Secondary | ICD-10-CM | POA: Diagnosis not present

## 2016-10-21 DIAGNOSIS — R0989 Other specified symptoms and signs involving the circulatory and respiratory systems: Secondary | ICD-10-CM

## 2016-10-21 HISTORY — PX: HIATAL HERNIA REPAIR: SHX195

## 2016-10-21 HISTORY — DX: Failed or difficult intubation, initial encounter: T88.4XXA

## 2016-10-21 LAB — PREPARE RBC (CROSSMATCH)

## 2016-10-21 LAB — BASIC METABOLIC PANEL
Anion gap: 8 (ref 5–15)
BUN: 12 mg/dL (ref 6–20)
CHLORIDE: 110 mmol/L (ref 101–111)
CO2: 22 mmol/L (ref 22–32)
Calcium: 8.2 mg/dL — ABNORMAL LOW (ref 8.9–10.3)
Creatinine, Ser: 0.8 mg/dL (ref 0.44–1.00)
GLUCOSE: 180 mg/dL — AB (ref 65–99)
POTASSIUM: 3.8 mmol/L (ref 3.5–5.1)
SODIUM: 140 mmol/L (ref 135–145)

## 2016-10-21 LAB — CBC
HEMATOCRIT: 29.9 % — AB (ref 35.0–47.0)
HEMATOCRIT: 26.4 % — AB (ref 35.0–47.0)
HEMOGLOBIN: 9.9 g/dL — AB (ref 12.0–16.0)
Hemoglobin: 8.6 g/dL — ABNORMAL LOW (ref 12.0–16.0)
MCH: 29.6 pg (ref 26.0–34.0)
MCH: 30.3 pg (ref 26.0–34.0)
MCHC: 32.9 g/dL (ref 32.0–36.0)
MCHC: 32.8 g/dL (ref 32.0–36.0)
MCV: 90.1 fL (ref 80.0–100.0)
MCV: 92.4 fL (ref 80.0–100.0)
Platelets: 271 10*3/uL (ref 150–440)
Platelets: 224 10*3/uL (ref 150–440)
RBC: 3.32 MIL/uL — ABNORMAL LOW (ref 3.80–5.20)
RBC: 2.85 MIL/uL — ABNORMAL LOW (ref 3.80–5.20)
RDW: 16.1 % — ABNORMAL HIGH (ref 11.5–14.5)
RDW: 16.4 % — ABNORMAL HIGH (ref 11.5–14.5)
WBC: 5.5 10*3/uL (ref 3.6–11.0)
WBC: 6.1 10*3/uL (ref 3.6–11.0)

## 2016-10-21 LAB — BLOOD GAS, ARTERIAL
ACID-BASE DEFICIT: 4.3 mmol/L — AB (ref 0.0–2.0)
Bicarbonate: 22.5 mmol/L (ref 20.0–28.0)
FIO2: 40
O2 SAT: 93.7 %
PH ART: 7.27 — AB (ref 7.350–7.450)
Patient temperature: 37
pCO2 arterial: 49 mmHg — ABNORMAL HIGH (ref 32.0–48.0)
pO2, Arterial: 79 mmHg — ABNORMAL LOW (ref 83.0–108.0)

## 2016-10-21 LAB — PROTIME-INR
INR: 1.21
Prothrombin Time: 15.4 seconds — ABNORMAL HIGH (ref 11.4–15.2)

## 2016-10-21 LAB — APTT: APTT: 26 s (ref 24–36)

## 2016-10-21 LAB — ABO/RH: ABO/RH(D): O POS

## 2016-10-21 SURGERY — REPAIR, HERNIA, HIATAL, LAPAROSCOPIC
Anesthesia: General

## 2016-10-21 MED ORDER — LACTATED RINGERS IV SOLN
INTRAVENOUS | Status: DC
  Administered 2016-10-21 – 2016-10-22 (×3): via INTRAVENOUS

## 2016-10-21 MED ORDER — ALBUTEROL SULFATE (2.5 MG/3ML) 0.083% IN NEBU
2.5000 mg | INHALATION_SOLUTION | Freq: Four times a day (QID) | RESPIRATORY_TRACT | Status: DC | PRN
  Administered 2016-10-22 – 2016-10-24 (×3): 2.5 mg via RESPIRATORY_TRACT
  Filled 2016-10-21 (×5): qty 3

## 2016-10-21 MED ORDER — MORPHINE SULFATE (PF) 4 MG/ML IV SOLN
2.0000 mg | INTRAVENOUS | Status: DC | PRN
  Administered 2016-10-21 – 2016-10-24 (×6): 2 mg via INTRAVENOUS
  Filled 2016-10-21 (×6): qty 1

## 2016-10-21 MED ORDER — ONDANSETRON HCL 4 MG/2ML IJ SOLN: 4.0000 mg | Freq: Four times a day (QID) | INTRAMUSCULAR | Status: DC | PRN

## 2016-10-21 MED ORDER — ROCURONIUM BROMIDE 50 MG/5ML IV SOLN
INTRAVENOUS | Status: AC
  Filled 2016-10-21: qty 1

## 2016-10-21 MED ORDER — FAMOTIDINE 20 MG PO TABS
ORAL_TABLET | ORAL | Status: AC
  Administered 2016-10-21: 20 mg via ORAL
  Filled 2016-10-21: qty 1

## 2016-10-21 MED ORDER — LIDOCAINE HCL (CARDIAC) 20 MG/ML IV SOLN
INTRAVENOUS | Status: DC | PRN
  Administered 2016-10-21: 40 mg via INTRAVENOUS
  Administered 2016-10-21: 10 mg via INTRAVENOUS

## 2016-10-21 MED ORDER — SODIUM CHLORIDE 0.9 % IV SOLN
INTRAVENOUS | Status: DC | PRN
  Administered 2016-10-21: 30 ug/min via INTRAVENOUS

## 2016-10-21 MED ORDER — LIDOCAINE HCL (PF) 2 % IJ SOLN
INTRAMUSCULAR | Status: AC
  Filled 2016-10-21: qty 2

## 2016-10-21 MED ORDER — ALBUMIN HUMAN 5 % IV SOLN
INTRAVENOUS | Status: AC
  Filled 2016-10-21: qty 250

## 2016-10-21 MED ORDER — FENTANYL CITRATE (PF) 100 MCG/2ML IJ SOLN
INTRAMUSCULAR | Status: AC
  Filled 2016-10-21: qty 2

## 2016-10-21 MED ORDER — FAMOTIDINE 20 MG PO TABS
20.0000 mg | ORAL_TABLET | Freq: Once | ORAL | Status: AC
  Administered 2016-10-21: 20 mg via ORAL

## 2016-10-21 MED ORDER — SUGAMMADEX SODIUM 200 MG/2ML IV SOLN
INTRAVENOUS | Status: AC
  Filled 2016-10-21: qty 2

## 2016-10-21 MED ORDER — SODIUM CHLORIDE 0.9 % IJ SOLN
INTRAMUSCULAR | Status: DC | PRN
  Administered 2016-10-21: 50 mL

## 2016-10-21 MED ORDER — PHENYLEPHRINE HCL 10 MG/ML IJ SOLN
INTRAMUSCULAR | Status: DC | PRN
  Administered 2016-10-21 (×5): 100 ug via INTRAVENOUS
  Administered 2016-10-21: 200 ug via INTRAVENOUS
  Administered 2016-10-21 (×3): 100 ug via INTRAVENOUS

## 2016-10-21 MED ORDER — PROCHLORPERAZINE EDISYLATE 5 MG/ML IJ SOLN
5.0000 mg | Freq: Four times a day (QID) | INTRAMUSCULAR | Status: DC | PRN
  Filled 2016-10-21: qty 2

## 2016-10-21 MED ORDER — SUGAMMADEX SODIUM 200 MG/2ML IV SOLN
INTRAVENOUS | Status: DC | PRN
  Administered 2016-10-21: 180 mg via INTRAVENOUS

## 2016-10-21 MED ORDER — OXYCODONE HCL 5 MG/5ML PO SOLN: 5.0000 mg | Freq: Once | ORAL | Status: DC | PRN

## 2016-10-21 MED ORDER — SODIUM CHLORIDE 0.9 % IJ SOLN
INTRAMUSCULAR | Status: AC
  Filled 2016-10-21: qty 50

## 2016-10-21 MED ORDER — ALBUMIN HUMAN 5 % IV SOLN: 25.0000 g | Freq: Once | INTRAVENOUS | Status: DC

## 2016-10-21 MED ORDER — MIDAZOLAM HCL 2 MG/2ML IJ SOLN
INTRAMUSCULAR | Status: AC
  Filled 2016-10-21: qty 2

## 2016-10-21 MED ORDER — ROCURONIUM BROMIDE 100 MG/10ML IV SOLN
INTRAVENOUS | Status: DC | PRN
  Administered 2016-10-21: 10 mg via INTRAVENOUS
  Administered 2016-10-21: 40 mg via INTRAVENOUS
  Administered 2016-10-21 (×5): 10 mg via INTRAVENOUS

## 2016-10-21 MED ORDER — DEXAMETHASONE SODIUM PHOSPHATE 10 MG/ML IJ SOLN
INTRAMUSCULAR | Status: DC | PRN
  Administered 2016-10-21: 8 mg via INTRAVENOUS

## 2016-10-21 MED ORDER — BUPIVACAINE-EPINEPHRINE 0.25% -1:200000 IJ SOLN
INTRAMUSCULAR | Status: DC | PRN
  Administered 2016-10-21: 30 mL

## 2016-10-21 MED ORDER — EVICEL 5 ML EX KIT
PACK | CUTANEOUS | Status: DC | PRN
  Administered 2016-10-21: 5 mL

## 2016-10-21 MED ORDER — PROPOFOL 10 MG/ML IV BOLUS
INTRAVENOUS | Status: DC | PRN
  Administered 2016-10-21: 110 mg via INTRAVENOUS

## 2016-10-21 MED ORDER — LACTATED RINGERS IV SOLN
INTRAVENOUS | Status: DC
  Administered 2016-10-21 (×4): via INTRAVENOUS

## 2016-10-21 MED ORDER — BUPIVACAINE LIPOSOME 1.3 % IJ SUSP
INTRAMUSCULAR | Status: AC
  Filled 2016-10-21: qty 20

## 2016-10-21 MED ORDER — BUPIVACAINE LIPOSOME 1.3 % IJ SUSP
INTRAMUSCULAR | Status: DC | PRN
  Administered 2016-10-21: 20 mL

## 2016-10-21 MED ORDER — FENTANYL CITRATE (PF) 100 MCG/2ML IJ SOLN
INTRAMUSCULAR | Status: DC | PRN
  Administered 2016-10-21: 25 ug via INTRAVENOUS
  Administered 2016-10-21: 50 ug via INTRAVENOUS
  Administered 2016-10-21: 25 ug via INTRAVENOUS
  Administered 2016-10-21 (×2): 50 ug via INTRAVENOUS

## 2016-10-21 MED ORDER — VASOPRESSIN 20 UNIT/ML IV SOLN
INTRAVENOUS | Status: DC | PRN
  Administered 2016-10-21: 1 [IU] via INTRAVENOUS

## 2016-10-21 MED ORDER — ONDANSETRON HCL 4 MG/2ML IJ SOLN
4.0000 mg | Freq: Once | INTRAMUSCULAR | Status: AC
  Administered 2016-10-21: 4 mg via INTRAVENOUS

## 2016-10-21 MED ORDER — OXYCODONE HCL 5 MG PO TABS: 5.0000 mg | ORAL_TABLET | Freq: Once | ORAL | Status: DC | PRN

## 2016-10-21 MED ORDER — BUPIVACAINE-EPINEPHRINE (PF) 0.25% -1:200000 IJ SOLN
INTRAMUSCULAR | Status: AC
  Filled 2016-10-21: qty 30

## 2016-10-21 MED ORDER — LIDOCAINE HCL 2 % EX GEL
CUTANEOUS | Status: AC
  Filled 2016-10-21: qty 5

## 2016-10-21 MED ORDER — OXYCODONE HCL 5 MG/5ML PO SOLN
7.5000 mg | ORAL | Status: DC | PRN
  Administered 2016-10-21 – 2016-10-27 (×9): 7.5 mg via ORAL
  Filled 2016-10-21 (×9): qty 10

## 2016-10-21 MED ORDER — PANTOPRAZOLE SODIUM 40 MG IV SOLR
40.0000 mg | Freq: Every day | INTRAVENOUS | Status: DC
  Administered 2016-10-21 – 2016-10-23 (×3): 40 mg via INTRAVENOUS
  Filled 2016-10-21 (×5): qty 40

## 2016-10-21 MED ORDER — KETOROLAC TROMETHAMINE 30 MG/ML IJ SOLN
INTRAMUSCULAR | Status: AC
  Filled 2016-10-21: qty 1

## 2016-10-21 MED ORDER — SUGAMMADEX SODIUM 500 MG/5ML IV SOLN
INTRAVENOUS | Status: AC
  Filled 2016-10-21: qty 5

## 2016-10-21 MED ORDER — FENTANYL CITRATE (PF) 100 MCG/2ML IJ SOLN
25.0000 ug | INTRAMUSCULAR | Status: DC | PRN
  Administered 2016-10-21: 25 ug via INTRAVENOUS
  Administered 2016-10-21 (×2): 50 ug via INTRAVENOUS
  Administered 2016-10-21: 25 ug via INTRAVENOUS

## 2016-10-21 MED ORDER — VASOPRESSIN 20 UNIT/ML IV SOLN
INTRAVENOUS | Status: AC
  Filled 2016-10-21: qty 1

## 2016-10-21 MED ORDER — PROCHLORPERAZINE MALEATE 10 MG PO TABS
10.0000 mg | ORAL_TABLET | Freq: Four times a day (QID) | ORAL | Status: DC | PRN
  Filled 2016-10-21: qty 1

## 2016-10-21 MED ORDER — ALBUTEROL SULFATE HFA 108 (90 BASE) MCG/ACT IN AERS: 1.0000 | INHALATION_SPRAY | Freq: Four times a day (QID) | RESPIRATORY_TRACT | Status: DC | PRN

## 2016-10-21 MED ORDER — ACETAMINOPHEN 10 MG/ML IV SOLN
INTRAVENOUS | Status: AC
  Filled 2016-10-21: qty 100

## 2016-10-21 MED ORDER — MIDAZOLAM HCL 2 MG/2ML IJ SOLN
INTRAMUSCULAR | Status: DC | PRN
  Administered 2016-10-21: 2 mg via INTRAVENOUS

## 2016-10-21 MED ORDER — LACTATED RINGERS IV SOLN
INTRAVENOUS | Status: DC | PRN
  Administered 2016-10-21: 13:00:00 via INTRAVENOUS

## 2016-10-21 MED ORDER — ONDANSETRON HCL 4 MG/2ML IJ SOLN
INTRAMUSCULAR | Status: AC
  Filled 2016-10-21: qty 2

## 2016-10-21 MED ORDER — PROPOFOL 10 MG/ML IV BOLUS
INTRAVENOUS | Status: AC
  Filled 2016-10-21: qty 20

## 2016-10-21 MED ORDER — ONDANSETRON HCL 4 MG/2ML IJ SOLN
INTRAMUSCULAR | Status: DC | PRN
  Administered 2016-10-21: 4 mg via INTRAVENOUS

## 2016-10-21 MED ORDER — ALBUMIN HUMAN 5 % IV SOLN
INTRAVENOUS | Status: DC | PRN
  Administered 2016-10-21: 15:00:00 via INTRAVENOUS

## 2016-10-21 MED ORDER — EPHEDRINE SULFATE 50 MG/ML IJ SOLN
INTRAMUSCULAR | Status: DC | PRN
  Administered 2016-10-21: 10 mg via INTRAVENOUS

## 2016-10-21 MED ORDER — EVICEL 5 ML EX KIT
PACK | CUTANEOUS | Status: AC
  Filled 2016-10-21: qty 1

## 2016-10-21 MED ORDER — ONDANSETRON 4 MG PO TBDP: 4.0000 mg | ORAL_TABLET | Freq: Four times a day (QID) | ORAL | Status: DC | PRN

## 2016-10-21 SURGICAL SUPPLY — 73 items
APPLIER CLIP ROT 10 11.4 M/L (STAPLE) ×3
BLADE SURG SZ11 CARB STEEL (BLADE) ×3 IMPLANT
CANISTER SUCT 1200ML W/VALVE (MISCELLANEOUS) ×3 IMPLANT
CATH FOL LEG HOLDER (MISCELLANEOUS) ×3 IMPLANT
CHLORAPREP W/TINT 26ML (MISCELLANEOUS) ×3 IMPLANT
CLIP APPLIE ROT 10 11.4 M/L (STAPLE) ×1 IMPLANT
DEFOGGER SCOPE WARMER CLEARIFY (MISCELLANEOUS) ×3 IMPLANT
DERMABOND ADVANCED (GAUZE/BANDAGES/DRESSINGS) ×2
DERMABOND ADVANCED .7 DNX12 (GAUZE/BANDAGES/DRESSINGS) ×1 IMPLANT
DEVICE SECURE STRAP 25 ABSORB (INSTRUMENTS) IMPLANT
DEVICE SUTURE ENDOST 10MM (ENDOMECHANICALS) IMPLANT
DRAIN PENROSE 5/8X18 LTX STRL (WOUND CARE) ×3 IMPLANT
DRAPE INCISE IOBAN 66X45 STRL (DRAPES) ×3 IMPLANT
DRAPE LAPAROTOMY 100X77 ABD (DRAPES) IMPLANT
DRAPE LEGGINS SURG 28X43 STRL (DRAPES) ×3 IMPLANT
DRSG OPSITE POSTOP 4X6 (GAUZE/BANDAGES/DRESSINGS) IMPLANT
ELECT BLADE 6.5 EXT (BLADE) ×3 IMPLANT
ELECT CAUTERY BLADE 6.4 (BLADE) ×3 IMPLANT
ELECT REM PT RETURN 9FT ADLT (ELECTROSURGICAL) ×3
ELECTRODE REM PT RTRN 9FT ADLT (ELECTROSURGICAL) ×1 IMPLANT
GLOVE BIO SURGEON STRL SZ7 (GLOVE) ×9 IMPLANT
GLOVE BIOGEL M 7.0 STRL (GLOVE) ×12 IMPLANT
GOWN STRL REUS W/ TWL LRG LVL3 (GOWN DISPOSABLE) ×4 IMPLANT
GOWN STRL REUS W/TWL LRG LVL3 (GOWN DISPOSABLE) ×8
GRASPER SUT TROCAR 14GX15 (MISCELLANEOUS) IMPLANT
HANDLE YANKAUER SUCT BULB TIP (MISCELLANEOUS) ×6 IMPLANT
HEMOSTAT ARISTA ABSORB 3G PWDR (MISCELLANEOUS) ×3 IMPLANT
HEMOSTAT SURGICEL 2X14 (HEMOSTASIS) ×6 IMPLANT
IRRIGATION STRYKERFLOW (MISCELLANEOUS) ×1 IMPLANT
IRRIGATOR STRYKERFLOW (MISCELLANEOUS) ×3
IV NS 1000ML (IV SOLUTION) ×2
IV NS 1000ML BAXH (IV SOLUTION) ×1 IMPLANT
KIT PINK PAD W/HEAD ARE REST (MISCELLANEOUS) ×3
KIT PINK PAD W/HEAD ARM REST (MISCELLANEOUS) ×1 IMPLANT
L-HOOK LAP DISP 36CM (ELECTROSURGICAL)
LABEL OR SOLS (LABEL) ×3 IMPLANT
LHOOK LAP DISP 36CM (ELECTROSURGICAL) IMPLANT
MESH BIO-A 7X10 SYN MAT (Mesh General) ×3 IMPLANT
NDL SAFETY 22GX1.5 (NEEDLE) ×3 IMPLANT
NS IRRIG 500ML POUR BTL (IV SOLUTION) ×3 IMPLANT
PACK BASIN MAJOR ARMC (MISCELLANEOUS) IMPLANT
PACK LAP CHOLECYSTECTOMY (MISCELLANEOUS) ×3 IMPLANT
PENCIL ELECTRO HAND CTR (MISCELLANEOUS) ×3 IMPLANT
SCISSORS METZENBAUM CVD 33 (INSTRUMENTS) IMPLANT
SHEARS HARMONIC ACE PLUS 36CM (ENDOMECHANICALS) ×3 IMPLANT
SLEEVE ENDOPATH XCEL 5M (ENDOMECHANICALS) ×3 IMPLANT
SPONGE LAP 18X18 5 PK (GAUZE/BANDAGES/DRESSINGS) ×12 IMPLANT
STAPLER SKIN PROX 35W (STAPLE) IMPLANT
SUT ETHIBOND 0 36 GRN (SUTURE) IMPLANT
SUT ETHIBOND 2-0 (SUTURE) ×18 IMPLANT
SUT MNCRL 4-0 (SUTURE)
SUT MNCRL 4-0 27XMFL (SUTURE)
SUT PDS AB 1 TP1 96 (SUTURE) IMPLANT
SUT SILK 2 0 (SUTURE) ×2
SUT SILK 2 0 SH (SUTURE) ×3 IMPLANT
SUT SILK 2 0SH CR/8 30 (SUTURE) ×3 IMPLANT
SUT SILK 2-0 18XBRD TIE 12 (SUTURE) ×1 IMPLANT
SUT VIC AB 0 CT1 36 (SUTURE) ×9 IMPLANT
SUT VIC AB 2-0 CT1 (SUTURE) IMPLANT
SUT VIC AB 3-0 SH 27 (SUTURE)
SUT VIC AB 3-0 SH 27X BRD (SUTURE) IMPLANT
SUT VICRYL 0 AB UR-6 (SUTURE) ×6 IMPLANT
SUTURE MNCRL 4-0 27XMF (SUTURE) IMPLANT
SYR 50ML LL SCALE MARK (SYRINGE) ×3 IMPLANT
TRAY FOLEY CATH SILVER 16FR LF (SET/KITS/TRAYS/PACK) ×3 IMPLANT
TRAY FOLEY W/METER SILVER 16FR (SET/KITS/TRAYS/PACK) IMPLANT
TROCAR 12M 150ML BLUNT (TROCAR) IMPLANT
TROCAR XCEL BLUNT TIP 100MML (ENDOMECHANICALS) ×3 IMPLANT
TROCAR XCEL NON-BLD 11X100MML (ENDOMECHANICALS) ×3 IMPLANT
TROCAR XCEL NON-BLD 5MMX100MML (ENDOMECHANICALS) ×3 IMPLANT
TUBING CONNECTING 10 (TUBING) ×2 IMPLANT
TUBING CONNECTING 10' (TUBING) ×1
TUBING INSUF HEATED (TUBING) ×3 IMPLANT

## 2016-10-21 NOTE — Anesthesia Post-op Follow-up Note (Cosign Needed)
Anesthesia QCDR form completed.        

## 2016-10-21 NOTE — Progress Notes (Signed)
Patient visited for a postoperative check. She feels better now that her pain is being managed better with IV morphine. Vital signs reviewed in stable Patient is awake alert and oriented and appears well. Continue to monitor in the postoperative period/stable at this point

## 2016-10-21 NOTE — Anesthesia Preprocedure Evaluation (Signed)
Anesthesia Evaluation  Patient identified by MRN, date of birth, ID band Patient awake    Reviewed: Allergy & Precautions, H&P , NPO status , Patient's Chart, lab work & pertinent test results  History of Anesthesia Complications (+) PONV, Family history of anesthesia reaction and history of anesthetic complications  Airway Mallampati: III  TM Distance: <3 FB Neck ROM: limited    Dental  (+) Poor Dentition, Chipped, Missing   Pulmonary shortness of breath and with exertion, COPD, former smoker,           Cardiovascular Exercise Tolerance: Poor (-) angina+ DOE  (-) Past MI      Neuro/Psych PSYCHIATRIC DISORDERS Anxiety  Neuromuscular disease    GI/Hepatic Neg liver ROS, hiatal hernia, neg GERD  ,  Endo/Other  negative endocrine ROS  Renal/GU      Musculoskeletal  (+) Arthritis ,   Abdominal   Peds  Hematology negative hematology ROS (+)   Anesthesia Other Findings Past Medical History: No date: Anxiety No date: Arthritis No date: Bronchitis No date: Dyspnea     Comment: with exertion No date: Family history of adverse reaction to anesthes*     Comment: Sister "died from anesthesia" many years ago               during thyroid surgery No date: History of hiatal hernia No date: Motion sickness     Comment: cars No date: Panic attacks  Past Surgical History: 1982: CESAREAN SECTION 09/18/2016: ESOPHAGOGASTRODUODENOSCOPY (EGD) WITH PROPOFOL N/A     Comment: Procedure: ESOPHAGOGASTRODUODENOSCOPY (EGD)               WITH PROPOFOL;  Surgeon: Lucilla Lame, MD;                Location: [place];  Service:               Endoscopy;  Laterality: N/A; No date: TONSILLECTOMY  BMI    Body Mass Index:  34.37 kg/m      Reproductive/Obstetrics negative OB ROS                             Anesthesia Physical Anesthesia Plan  ASA: III  Anesthesia Plan: General ETT   Post-op  Pain Management:    Induction: Intravenous  PONV Risk Score and Plan: 4 or greater and Ondansetron, Dexamethasone, Propofol, Midazolam and Treatment may vary due to age or medical condition  Airway Management Planned: Oral ETT  Additional Equipment:   Intra-op Plan:   Post-operative Plan: Extubation in OR  Informed Consent: I have reviewed the patients History and Physical, chart, labs and discussed the procedure including the risks, benefits and alternatives for the proposed anesthesia with the patient or authorized representative who has indicated his/her understanding and acceptance.   Dental Advisory Given  Plan Discussed with: Anesthesiologist, CRNA and Surgeon  Anesthesia Plan Comments: (Patient consented for risks of anesthesia including but not limited to:  - adverse reactions to medications - damage to teeth, lips or other oral mucosa - sore throat or hoarseness - Damage to heart, brain, lungs or loss of life  Patient voiced understanding.)        Anesthesia Quick Evaluation

## 2016-10-21 NOTE — Anesthesia Procedure Notes (Signed)
Performed by: Baxter Gonzalez       

## 2016-10-21 NOTE — Transfer of Care (Signed)
Immediate Anesthesia Transfer of Care Note  Patient: Yvette Jackson  Procedure(s) Performed: Procedure(s): LAPAROSCOPIC REPAIR OF HIATAL HERNIA (N/A) HERNIA REPAIR HIATAL (N/A)  Patient Location: PACU  Anesthesia Type:General  Level of Consciousness: sedated  Airway & Oxygen Therapy: Patient Spontanous Breathing and Patient connected to face mask oxygen  Post-op Assessment: Report given to RN and Post -op Vital signs reviewed and stable  Post vital signs: Reviewed and stable  Last Vitals:  Vitals:   10/21/16 0942 10/21/16 1553  BP: (!) 132/94 (!) 106/57  Pulse: 88 76  Resp: 16 18  Temp: 36.9 C (!) 36.1 C    Last Pain:  Vitals:   10/21/16 0942  TempSrc: Oral         Complications: No apparent anesthesia complications

## 2016-10-21 NOTE — Anesthesia Postprocedure Evaluation (Signed)
Anesthesia Post Note  Patient: Yvette Jackson  Procedure(s) Performed: Procedure(s) (LRB): LAPAROSCOPIC REPAIR OF HIATAL HERNIA (N/A) HERNIA REPAIR HIATAL (N/A)  Patient location during evaluation: PACU Anesthesia Type: General Level of consciousness: awake and alert Pain management: pain level controlled Vital Signs Assessment: post-procedure vital signs reviewed and stable Respiratory status: spontaneous breathing, nonlabored ventilation, respiratory function stable and patient connected to nasal cannula oxygen Cardiovascular status: blood pressure returned to baseline and stable Postop Assessment: no signs of nausea or vomiting Anesthetic complications: no Comments: Patient initially stable on arrival to the PACU, but within 30 minutes had an episode of spitting up blood.  Surgeon notified and patient appeared stable.  Then she had a second episode of spitting up blood with some hypotension.  Surgeon immediately notified and arrived at bedside along with myself.  Obtained labs, CXR, and ordered a type and crossmatch.  Patient examined by surgery who noted nothing abnormal.  Patient stabilized her blood pressure and had no further episodes of hematemesis.  Likely a vasovagal episode and surgeon felt that the blood was normal given the surgery that was performed.  After stabilization we watched the patient to ensure no further episodes and then she was felt able to go to her room.     Last Vitals:  Vitals:   10/21/16 1833 10/21/16 1942  BP: 130/70 136/86  Pulse: 88 92  Resp: (!) 22 (!) 24  Temp: 36.5 C 36.6 C    Last Pain:  Vitals:   10/21/16 1942  TempSrc: Oral  PainSc:                  Martha Clan

## 2016-10-21 NOTE — Op Note (Signed)
PROCEDURES 1. Attempted laparoscopic paraesophageal hernia repair 2. Open laparoscopic hiatal hernia repair with biOA mesh and Nissen fundoplication  Pre-operative Diagnosis: Symptomatic type III paraesophageal hernia  Post-operative Diagnosis: same   Surgeon: Caroleen Hamman, MD FACS  Anesthesia: Gen. with endotracheal tube  Assistant:Dr. Oaks   Findings: Difficult case due to patient body habitus and significant esophageal hernia with a large sac. We encountered some brisk bleeding when attempt to perform the surgery laparoscopically and we Quickly converted to open Mark capsular tear in the spleen and liver from retraction that was probably controlled with hemostatic agents   Estimated Blood Loss: 350cc         Drains: none         Specimens: Gallbladder           Complications: none   Procedure Details  The patient was seen again in the Holding Room. The benefits, complications, treatment options, and expected outcomes were discussed with the patient. The risks of bleeding, infection, recurrence of symptoms, failure to resolve symptoms, esophageal injury, bowel injury, any of which could require further surgery were reviewed with the patient. The likelihood of improving the patient's symptoms with return to their baseline status is good.  The patient and/or family concurred with the proposed plan, giving informed consent.  The patient was taken to Operating Room, identified as Yvette Jackson and the procedure verified.  A Time Out was held and the above information confirmed.  Prior to the induction of general anesthesia, antibiotic prophylaxis was administered. VTE prophylaxis was in place. General endotracheal anesthesia was then administered and tolerated well. She was placed on lithotomy position with all pressure points addressed with paddind. After the induction, the abdomen was prepped with Chloraprep and draped in the sterile fashion.   Cut down technique was used to enter  the abdominal cavity and a Hasson trochar was placed after two vicryl stitches were anchored to the fascia. Pneumoperitoneum was then created with CO2 and tolerated well without any adverse changes in the patient's vital signs.  Two 5-mm ports were placed in the right upper quadrant  And LUQ respectively. 11 mm port placed on the RUQ.  The patient was positioned  in reverse Trendelenburg.   We used the pars flaccida approach after the liver retractor was placed in the standard fashion. I opened the  pars flaccida and with the Harmonic scalpel we encountered brisk bleeding.  I decided to convert to an exploratory laparotomy and perform an upper midline incision with a 10 blade knife. Fascia was opened. We encountered the vessel and ligated w 2-0 silks. We then proceeded with our dissection. And divided from the pars flaccida all the way to the right crus. Attention then was turned to the greater curvature where a window was created and we divided the short gastric all the way to the left crus. An we also encountered significant hernia sac. The stomach was brought down intra-abdominally and multiple adhesions were lysed with the Harmonic scalpel. We were able to obtain circumferential dissection and bring the GE junction all the way to the abdominal cavity. We did notice a to a small Lacerations on the Liver and Spleen That Were Probably Addressed with a Combination of Hemostatic Agent and Surgicel and Pressure. Attention then was turned to the esophagus anesthesia was able to pass a 52 French bougie in a standard fashion. We were able to repair The posterior hiatus with interrupted 2-0 Ethibond sutures in the standard fashion. We is a 7 x  10 cm bio a Mesh and use it to cover our repair. We used Evaseal to fix it to the diaphragm in the standard fashion.  We then performed our 616 fundoplication over the bougie in the standard fashion with 2-0 ethibonds. The wrap was floppy and I was able to place a finger in  between the esophagus and the wrap. Second look revealed no evidence of any active bleeding or hemoperitoneum. And the lacerations on the both the liver and spleen stopped. We used liposomal Marcaine to infiltrate all the incision sites and closed the fascia in a continuous fashion with 0 PDS suture in the standard fashion. Staples were used to close the incision. Sterile dressing was applied.  Sponge, lap, and needle counts were correct at closure and at the conclusion of the case.               Caroleen Hamman, MD, FACS

## 2016-10-21 NOTE — H&P (View-Only) (Signed)
Surgical Consultation  09/25/2016  Yvette Jackson is an 66 y.o. female.   Chief Complaint  Patient presents with  . Follow-up    Hiatal Hernia. Discuss CT, PFT's, Lab work, EGD, and Barrium Swallow     HPI:  Yvette Jackson is a 66 year old female following now for a large hiatal hernia. She has completed her workup including barium swallow that I personally reviewed there is evidence of an obvious large hiatal hernia but there is good peristalsis within the esophagus. There is no masses. She also had an EGD by Dr. wall showing a large hiatal hernia with some areas changes. Pathology from this show some gastric mucosa. No evidence of dysplasia. Finally he her CT of the chest and abdomen was personally reviewed and there is evidence of a large hiatal hernia with a possible bullae component and. She clinically is feeling well but she still has some dyspnea on exertion. She is still able to tolerate by mouth and does not have dysphagia at this time PFTs showed moderate obstructive airway disease responsive to bronchodilators She quit smoking 3 months ago  Past Medical History:  Diagnosis Date  . Bronchitis   . Dyspnea   . Family history of adverse reaction to anesthesia    Sister "died from anesthesia" many years ago during thyroid surgery  . History of hiatal hernia   . Motion sickness    cars    Past Surgical History:  Procedure Laterality Date  . CESAREAN SECTION    . ESOPHAGOGASTRODUODENOSCOPY (EGD) WITH PROPOFOL N/A 09/18/2016   Procedure: ESOPHAGOGASTRODUODENOSCOPY (EGD) WITH PROPOFOL;  Surgeon: Lucilla Lame, MD;  Location: [place];  Service: Endoscopy;  Laterality: N/A;  . TONSILLECTOMY      Family History  Problem Relation Age of Onset  . Diabetes Mother   . Heart disease Mother   . COPD Father     Social History:  reports that she quit smoking about 4 months ago. Her smoking use included Cigarettes. She has a 40.00 pack-year smoking history. She has never used  smokeless tobacco. She reports that she does not drink alcohol or use drugs.  Allergies:  Allergies  Allergen Reactions  . Acetaminophen Hives    Pt takes Advil at home for pain  . Codeine Rash  . Sulfa Antibiotics Swelling    Per pt takes Advil at home.     Medications reviewed.     ROS Full ROS performed and is otherwise negative other than what is stated in the HPI    BP 127/85   Pulse 78   Temp 98.4 F (36.9 C) (Oral)   Ht 5\' 3"  (1.6 m)   Wt 88.3 kg (194 lb 9.6 oz)   BMI 34.47 kg/m    Physical Exam  Constitutional: She is oriented to person, place, and time and well-developed, well-nourished, and in no distress. No distress.  Eyes: No scleral icterus.  Neck: Neck supple. No JVD present. No tracheal deviation present. No thyromegaly present.  Cardiovascular: Normal rate and regular rhythm.   Pulmonary/Chest: Effort normal. No respiratory distress. She has no wheezes. She has no rales. She exhibits no tenderness.  Abdominal: Bowel sounds are normal. She exhibits no distension. There is no tenderness. There is no rebound and no guarding.  Lower midline laparotomy scar  Musculoskeletal: Normal range of motion. She exhibits no edema.  Neurological: She is alert and oriented to person, place, and time. Gait normal. GCS score is 15.  Skin: Skin is warm and dry. She is  not diaphoretic.  Psychiatric: Mood, memory, affect and judgment normal.  Nursing note and vitals reviewed.  Assessment/Plan: Large hiatal hernia that might be causing some of pulmonary symptoms. He is also some concern for potential volvulus or incarceration of this hernia. I given the above findings I do recommend surgical repair. Unfortunate she does have some dyspnea on exertion and I will request a pulmonary consult before doing the final procedure. We discussed in detail about the surgery laparoscopic had a hernia repair with mesh versus open repair. I do think that there is a good chance that we can do  it laparoscopically but certainly an open approach is possible. When she had clearance from pulmonary we can go ahead and schedule her for laparoscopic repair possible open. Extensive counseling provided.. I spent greater than 40 minutes in this encounter with more than 50% of time spent in coordinating/counseling her care.  Caroleen Hamman, MD [facility] General Surgeon

## 2016-10-21 NOTE — Interval H&P Note (Signed)
History and Physical Interval Note:  10/21/2016 10:56 AM  Yvette Jackson  has presented today for surgery, with the diagnosis of HIATAL HERNIA  The various methods of treatment have been discussed with the patient and family. After consideration of risks, benefits and other options for treatment, the patient has consented to  Procedure(s): [place] (N/A) HERNIA REPAIR HIATAL (N/A) as a surgical intervention .  The patient's history has been reviewed, patient examined, no change in status, stable for surgery.  I have reviewed the patient's chart and labs.  Questions were answered to the patient's satisfaction.     [name]

## 2016-10-21 NOTE — Progress Notes (Signed)
Called by RN for hypotension and hemoptysis. I saw the pt and examined her. By the time I got there her BP was already better, she was placed on O2 and hemoptysis had stopped Her HR 70-80s, BP 120/70 Getting albumin  PE sedated from recent surgery Abd: soft, Appropriate tenderness to palpation, no peritonitis ABG resp acidosis CXR some atelectasis no PTX  A/p Hypotension likely vagal Ordered cbc, cmp and coags No need for emergent surgical intervention at this time

## 2016-10-22 LAB — CBC
HCT: 25.1 % — ABNORMAL LOW (ref 35.0–47.0)
Hemoglobin: 8.2 g/dL — ABNORMAL LOW (ref 12.0–16.0)
MCH: 29.3 pg (ref 26.0–34.0)
MCHC: 32.6 g/dL (ref 32.0–36.0)
MCV: 89.9 fL (ref 80.0–100.0)
PLATELETS: 188 10*3/uL (ref 150–440)
RBC: 2.79 MIL/uL — AB (ref 3.80–5.20)
RDW: 16.1 % — AB (ref 11.5–14.5)
WBC: 9.4 10*3/uL (ref 3.6–11.0)

## 2016-10-22 LAB — BASIC METABOLIC PANEL
Anion gap: 6 (ref 5–15)
BUN: 14 mg/dL (ref 6–20)
CHLORIDE: 109 mmol/L (ref 101–111)
CO2: 25 mmol/L (ref 22–32)
CREATININE: 0.96 mg/dL (ref 0.44–1.00)
Calcium: 8 mg/dL — ABNORMAL LOW (ref 8.9–10.3)
Glucose, Bld: 138 mg/dL — ABNORMAL HIGH (ref 65–99)
POTASSIUM: 4.3 mmol/L (ref 3.5–5.1)
SODIUM: 140 mmol/L (ref 135–145)

## 2016-10-22 LAB — HIV ANTIBODY (ROUTINE TESTING W REFLEX): HIV Screen 4th Generation wRfx: NONREACTIVE

## 2016-10-22 MED ORDER — BUDESONIDE 0.25 MG/2ML IN SUSP
0.2500 mg | Freq: Two times a day (BID) | RESPIRATORY_TRACT | Status: DC
  Administered 2016-10-22 – 2016-10-28 (×12): 0.25 mg via RESPIRATORY_TRACT
  Filled 2016-10-22 (×13): qty 2

## 2016-10-22 MED ORDER — ALBUTEROL SULFATE (2.5 MG/3ML) 0.083% IN NEBU
2.5000 mg | INHALATION_SOLUTION | Freq: Four times a day (QID) | RESPIRATORY_TRACT | Status: DC
  Administered 2016-10-22 – 2016-10-23 (×4): 2.5 mg via RESPIRATORY_TRACT
  Filled 2016-10-22 (×3): qty 3

## 2016-10-22 NOTE — Progress Notes (Signed)
CC: POD # 1 Subjective: Doing well Good U/O and HD VSS Taking clears well  Objective: Vital signs in last 24 hours: Temp:  [97 F (36.1 C)-99.5 F (37.5 C)] 99.2 F (37.3 C) (07/04 0503) Pulse Rate:  [75-112] 112 (07/04 0503) Resp:  [13-24] 18 (07/04 0503) BP: (75-136)/(56-94) 120/67 (07/04 0503) SpO2:  [95 %-100 %] 98 % (07/04 0503) Weight:  [88 kg (194 lb)] 88 kg (194 lb) (07/03 0942)    Intake/Output from previous day: 07/03 0701 - 07/04 0700 In: 4890 [I.V.:4590; IV Piggyback:300] Out: 1410 [Urine:1060; Blood:350] Intake/Output this shift: No intake/output data recorded.  Physical exam: NAD Abd: soft, appropriate inc tenderness, no peritonitis. Dressing intact w small tinged blood inferior portion. No infection Lab Results: CBC   Recent Labs  10/21/16 1625 10/22/16 0520  WBC 6.1 9.4  HGB 8.6* 8.2*  HCT 26.4* 25.1*  PLT 224 188   BMET  Recent Labs  10/21/16 1633 10/22/16 0520  NA 140 140  K 3.8 4.3  CL 110 109  CO2 22 25  GLUCOSE 180* 138*  BUN 12 14  CREATININE 0.80 0.96  CALCIUM 8.2* 8.0*   PT/INR  Recent Labs  10/21/16 1625  LABPROT 15.4*  INR 1.21   ABG  Recent Labs  10/21/16 1627  PHART 7.27*  HCO3 22.5    Studies/Results: Dg Chest Port 1 View  Result Date: 10/21/2016 CLINICAL DATA:  Rhonchi at left lung base s/p EXAM: PORTABLE CHEST 1 VIEW COMPARISON:  None. FINDINGS: Normal mediastinum and cardiac silhouette. Mild increase and upper lobe linear vascular pattern. LEFT retrocardiac opacity. No pneumothorax. No acute bony abnormality. IMPRESSION: 1. LEFT retrocardiac passed representing atelectasis versus infiltrate. 2. Mild interstitial edema. Electronically Signed   By: Suzy Bouchard M.D.   On: 10/21/2016 16:44    Anti-infectives: Anti-infectives    Start     Dose/Rate Route Frequency Ordered Stop   10/21/16 0600  ertapenem (INVANZ) 1 g in sodium chloride 0.9 % 50 mL IVPB     1 g 100 mL/hr over 30 Minutes Intravenous On  call to O.R. 10/20/16 2240 10/21/16 1148      Assessment/Plan: Doing well Decrease IVF Full liquids [place], MD, FACS  10/22/2016

## 2016-10-23 ENCOUNTER — Inpatient Hospital Stay: Payer: 59

## 2016-10-23 ENCOUNTER — Encounter: Payer: Self-pay | Admitting: Surgery

## 2016-10-23 LAB — BASIC METABOLIC PANEL
Anion gap: 3 — ABNORMAL LOW (ref 5–15)
BUN: 20 mg/dL (ref 6–20)
CO2: 29 mmol/L (ref 22–32)
CREATININE: 0.99 mg/dL (ref 0.44–1.00)
Calcium: 8.3 mg/dL — ABNORMAL LOW (ref 8.9–10.3)
Chloride: 108 mmol/L (ref 101–111)
GFR, EST NON AFRICAN AMERICAN: 59 mL/min — AB (ref >60)
Glucose, Bld: 132 mg/dL — ABNORMAL HIGH (ref 65–99)
POTASSIUM: 4.1 mmol/L (ref 3.5–5.1)
SODIUM: 140 mmol/L (ref 135–145)

## 2016-10-23 MED ORDER — SPIRONOLACTONE 25 MG PO TABS
50.0000 mg | ORAL_TABLET | Freq: Once | ORAL | Status: AC
  Administered 2016-10-23: 50 mg via ORAL
  Filled 2016-10-23: qty 2

## 2016-10-23 MED ORDER — FUROSEMIDE 40 MG PO TABS: 40.0000 mg | ORAL_TABLET | Freq: Every day | ORAL | Status: DC

## 2016-10-23 MED ORDER — SODIUM CHLORIDE 0.9 % IV SOLN
INTRAVENOUS | Status: DC
  Administered 2016-10-23: 15:00:00 via INTRAVENOUS

## 2016-10-23 MED ORDER — ALBUTEROL SULFATE (2.5 MG/3ML) 0.083% IN NEBU
2.5000 mg | INHALATION_SOLUTION | Freq: Three times a day (TID) | RESPIRATORY_TRACT | Status: DC
  Administered 2016-10-23 – 2016-10-24 (×3): 2.5 mg via RESPIRATORY_TRACT
  Filled 2016-10-23 (×3): qty 3

## 2016-10-23 NOTE — Progress Notes (Signed)
CC: POD # 2 Subjective: Main complaint is SOB No abdominal pain Taking full liquids Creat ok CXR reviewed pleural effusion and congestion cardiomegaly  Objective: Vital signs in last 24 hours: Temp:  [98.4 F (36.9 C)-99.5 F (37.5 C)] 98.4 F (36.9 C) (07/05 0635) Pulse Rate:  [114-130] 114 (07/05 0635) Resp:  [18] 18 (07/05 0635) BP: (112-120)/(60-78) 120/78 (07/05 0635) SpO2:  [92 %-94 %] 94 % (07/05 0635) Last BM Date: 10/21/16  Intake/Output from previous day: 07/04 0701 - 07/05 0700 In: 963 [P.O.:240; I.V.:723] Out: 500 [Urine:500] Intake/Output this shift: No intake/output data recorded.  Physical exam: NAD, [place] Chest: bilateral ronchi, decrease bs on the left Abd: incision c/d/i, soft, no peritonitis or tenderness. No infection     Lab Results: CBC   Recent Labs  10/21/16 1625 10/22/16 0520  WBC 6.1 9.4  HGB 8.6* 8.2*  HCT 26.4* 25.1*  PLT 224 188   BMET  Recent Labs  10/22/16 0520 10/23/16 0558  NA 140 140  K 4.3 4.1  CL 109 108  CO2 25 29  GLUCOSE 138* 132*  BUN 14 20  CREATININE 0.96 0.99  CALCIUM 8.0* 8.3*   PT/INR  Recent Labs  10/21/16 1625  LABPROT 15.4*  INR 1.21   ABG  Recent Labs  10/21/16 1627  PHART 7.27*  HCO3 22.5    Studies/Results: Dg Chest Port 1 View  Result Date: 10/23/2016 CLINICAL DATA:  Small bowel obstruction. Hiatal hernia repair surgery 2 days ago. COPD. EXAM: PORTABLE CHEST 1 VIEW COMPARISON:  10/21/2016 FINDINGS: Retrocardiac airspace opacity with blunting of the costophrenic angle. Mildly low lung volumes. Mild cardiomegaly. Tubes project over the left upper quadrant, likely including a postoperative drain. Interstitial accentuation has improved. IMPRESSION: 1. Left basilar airspace opacity. By report, the hiatal hernia was completely reduced and accordingly this opacity is not felt to be due to hiatal hernia, but more likely left lower lobe atelectasis or pneumonia. Slight blunting of the left  costophrenic angle, small pleural effusion not excluded. 2. Mild cardiomegaly. The interstitial accentuation in the lungs has improved. Electronically Signed   By: Van Clines M.D.   On: 10/23/2016 08:50   Dg Chest Port 1 View  Result Date: 10/21/2016 CLINICAL DATA:  Rhonchi at left lung base s/p EXAM: PORTABLE CHEST 1 VIEW COMPARISON:  None. FINDINGS: Normal mediastinum and cardiac silhouette. Mild increase and upper lobe linear vascular pattern. LEFT retrocardiac opacity. No pneumothorax. No acute bony abnormality. IMPRESSION: 1. LEFT retrocardiac passed representing atelectasis versus infiltrate. 2. Mild interstitial edema. Electronically Signed   By: Suzy Bouchard M.D.   On: 10/21/2016 16:44    Anti-infectives: Anti-infectives    Start     Dose/Rate Route Frequency Ordered Stop   10/21/16 0600  ertapenem (INVANZ) 1 g in sodium chloride 0.9 % 50 mL IVPB     1 g 100 mL/hr over 30 Minutes Intravenous On call to O.R. 10/20/16 2240 10/21/16 1148      Assessment/Plan: Main issues is resp , I do think she is wet, start low dow diuretic and reasess continue full liquids , tele ambulate Caroleen Hamman, MD, FACS  10/23/2016

## 2016-10-23 NOTE — Progress Notes (Signed)
Seen and examined again Spironolactone given since she has an allergy to lasix Unsure of effectiveness of diuretic since there is some confusion among nursing staff. Apparently she did void about 200cc but data is not reliable No abd pain  She is non toxic but is taking shallow breaths HD adequate Abd: soft, dressing intact. No peritonitis  A/P Hypoxemia continue O2 Consult to pulmonary ( they already saw her and cleared her preop) D/W RT and  about pulm toilet Additional dose aldactone, d/w nursing staff about importance of strict I/O Labs in am

## 2016-10-23 NOTE — Progress Notes (Signed)
Date: 10/23/2016,   MRN# [id number] Yvette Jackson September 26, 1950 Code Status:     Code Status Orders        Start     Ordered   10/21/16 1705  Full code  Continuous     10/21/16 1709    Code Status History    Date Active Date Inactive Code Status Order ID Comments User Context   This patient has a current code status but no historical code status.     Hosp day:@LENGTHOFSTAYDAYS @ Referring MD: @ATDPROV @       CC: Post op need for oxygen  HPI: This is a 66 year old african [place] lady, ex smoker with ,moderate copd. Status post. Attempted laparoscopic paraesophageal repair, open laproscpic hiatal repair with Bioa mesh and Nissen fundoplication. She tolerated the procedure well. Asked to see regarding persistent need for oxygen supplementation. She is known to have moderate copd and ex smoker.   Today she is not c/o shortness of breath, wheezing or chest pain. No calf pain or swelling. Her chest xray noted. ? Fluid. Aldactone given, mild diuresis noted.  PMHX:   Past Medical History:  Diagnosis Date  . Anxiety   . Arthritis   . Bronchitis   . Difficult intubation   . Dyspnea    with exertion  . Family history of adverse reaction to anesthesia    Sister "died from anesthesia" many years ago during thyroid surgery  . History of hiatal hernia   . Motion sickness    cars  . Panic attacks    Surgical Hx:  Past Surgical History:  Procedure Laterality Date  . [place]  . ESOPHAGOGASTRODUODENOSCOPY (EGD) WITH PROPOFOL N/A 09/18/2016   Procedure: ESOPHAGOGASTRODUODENOSCOPY (EGD) WITH PROPOFOL;  Surgeon: Lucilla Lame, MD;  Location: [place];  Service: Endoscopy;  Laterality: N/A;  . HIATAL HERNIA REPAIR N/A 10/21/2016   Procedure: LAPAROSCOPIC REPAIR OF HIATAL HERNIA;  Surgeon: Jules Husbands, MD;  Location: [facility];  Service: General;  Laterality: N/A;  . HIATAL HERNIA REPAIR N/A 10/21/2016   Procedure: HERNIA REPAIR HIATAL;  Surgeon: Jules Husbands, MD;   Location: [facility];  Service: General;  Laterality: N/A;  . TONSILLECTOMY     Family Hx:  Family History  Problem Relation Age of Onset  . Diabetes Mother   . Heart disease Mother   . COPD Father    Social Hx:   Social History  Substance Use Topics  . Smoking status: Former Smoker    Packs/day: 1.00    Years: 40.00    Types: Cigarettes    Quit date: 05/06/2016  . Smokeless tobacco: Never Used  . Alcohol use No   Medication:    Home Medication:    Current Medication: @CURMEDTAB @   Allergies:  Acetaminophen; Codeine; Sulfa antibiotics; and Sulfasalazine  Review of Systems: Gen:  Denies  fever, sweats, chills HEENT: Denies blurred vision, double vision, ear pain, eye pain, hearing loss, nose bleeds, sore throat Cvc:  No dizziness, chest pain or heaviness Resp:   No use of accessory muscles, no wheezing, no rub Gi: Denies swallowing difficulty, stomach pain, nausea or vomiting, diarrhea, constipation, bowel incontinence Gu:  Denies bladder incontinence, burning urine Ext:   No Joint pain, stiffness or swelling Skin: No skin rash, easy bruising or bleeding or hives Endoc:  No polyuria, polydipsia , polyphagia or weight change Psych: No depression, insomnia or hallucinations  Other:  All other systems negative  Physical Examination:   VS: BP 130/77 (  BP Location: Left Arm)   Pulse (!) 110   Temp 98 F (36.7 C) (Oral)   Resp 16   Ht 5\' 3"  (1.6 m)   Wt 194 lb (88 kg)   SpO2 97%   BMI 34.37 kg/m   General Appearance: No distress  Neuro: without focal findings, mental status, speech normal, alert and oriented, cranial nerves 2-12 intact, reflexes normal and symmetric, sensation grossly normal  NECK: No stridor, no jvd HEENT: PERRLA, EOM intact, no ptosis, no other lesions noticed, Mallampati: Pulmonary:.No wheezing, No rales :  No use of accessory muscles, no o2 in place Cardiovascular:  Normal S1,S2.  No m/r/g.  l.    Abdomen:Benign, Soft, non-tender, No masses,  hepatosplenomegaly, No lymphadenopathy Endoc: No evident thyromegaly, no signs of acromegaly or Cushing features Skin:   warm, no rashes, no ecchymosis  Extremities: normal, no cyanosis, clubbing, no edema, warm with normal capillary refill.    Labs results:   Recent Labs     10/21/16  1312  10/21/16  1625  10/21/16  1633  10/22/16  0520  10/23/16  0558  HGB  9.9*  8.6*   --   8.2*   --   HCT  29.9*  26.4*   --   25.1*   --   MCV  90.1  92.4   --   89.9   --   WBC  5.5  6.1   --   9.4   --   BUN   --    --   12  14  20   CREATININE   --    --   0.80  0.96  0.99  GLUCOSE   --    --   180*  138*  132*  CALCIUM   --    --   8.2*  8.0*  8.3*  INR   --   1.21   --    --    --   ,    Rad results:  1. 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 Although this does not appear to be obstructive at this time, surgical consultation arch that nonemergent surgical consultation arch that nonemergent surgical consultation is recommended in the near future. 2. No other acute findings are noted in the abdomen or pelvis. 3. Cholelithiasis, without evidence of acute cholecystitis at this time. At least one of these gallstones appears adherent to the gallbladder wall. 4. Colonic diverticulosis without evidence of acute diverticulitis at this time. 5. There are calcifications of the aortic valve and anterior leaflet of the mitral valve. Echocardiographic correlation for evaluation of potential valvular dysfunction may be warranted if clinically indicated. 6. Additional incidental findings, as above.   Electronically Signed   By: Vinnie Langton M.D.   On: 09/12/2016 15:46   Dg Chest Port 1 View  Result Date: 10/23/2016 CLINICAL DATA:  Small bowel obstruction. Hiatal hernia repair surgery 2 days ago. COPD. EXAM: PORTABLE CHEST 1 VIEW COMPARISON:  10/21/2016 FINDINGS: Retrocardiac  airspace opacity with blunting of the costophrenic angle. Mildly low lung volumes. Mild cardiomegaly. Tubes project over the left upper quadrant, likely including a postoperative drain. Interstitial accentuation has improved. IMPRESSION: 1. Left basilar airspace opacity. By report, the hiatal hernia was completely reduced and accordingly this opacity is not felt to be due to hiatal hernia, but more likely left lower lobe atelectasis or pneumonia. Slight blunting of the left costophrenic angle, small  pleural effusion not excluded. 2. Mild cardiomegaly. The interstitial accentuation in the lungs has improved. Electronically Signed   By: Van Clines M.D.   On: 10/23/2016 08:50      Assessment and Plan: Hx of moderate copd, s/p open lap surgery to correct hiatus hernia repair. Suspect atelectasis, +/-pulmonary edema. Do not think she as a pneumonia at this time.   -increase ambulation (pt) -will wean down fio2 and keep sat 91-92 % -agree with incentive spirometry, dvt prophylaxis and cautious diuresis -albuterol/pulmicort via neb is appropiate -following with you   I have personally obtained a history, examined the patient, evaluated laboratory and imaging results, formulated the assessment and plan and placed orders.  The Patient requires high complexity decision making for assessment and support, frequent evaluation and titration of therapies, application of advanced monitoring technologies and extensive interpretation of multiple databases.   Yvette Jackson,M.D. Pulmonary & Critical care Medicine [facility]

## 2016-10-24 ENCOUNTER — Inpatient Hospital Stay
Admission: RE | Admit: 2016-10-24 | Discharge: 2016-10-24 | Disposition: A | Discharge status: Home / Self Care | Payer: 59 | Source: Ambulatory Visit | Attending: Surgery | Admitting: Surgery

## 2016-10-24 LAB — BASIC METABOLIC PANEL
Anion gap: 7 (ref 5–15)
BUN: 19 mg/dL (ref 6–20)
CO2: 25 mmol/L (ref 22–32)
Calcium: 8.3 mg/dL — ABNORMAL LOW (ref 8.9–10.3)
Chloride: 109 mmol/L (ref 101–111)
Creatinine, Ser: 0.9 mg/dL (ref 0.44–1.00)
Glucose, Bld: 104 mg/dL — ABNORMAL HIGH (ref 65–99)
POTASSIUM: 4.1 mmol/L (ref 3.5–5.1)
Sodium: 141 mmol/L (ref 135–145)

## 2016-10-24 LAB — CBC
HCT: 20.5 % — ABNORMAL LOW (ref 35.0–47.0)
Hemoglobin: 6.9 g/dL — ABNORMAL LOW (ref 12.0–16.0)
MCH: 30.6 pg (ref 26.0–34.0)
MCHC: 33.6 g/dL (ref 32.0–36.0)
MCV: 91 fL (ref 80.0–100.0)
PLATELETS: 172 10*3/uL (ref 150–440)
RBC: 2.25 MIL/uL — AB (ref 3.80–5.20)
RDW: 15.8 % — ABNORMAL HIGH (ref 11.5–14.5)
WBC: 9.8 10*3/uL (ref 3.6–11.0)

## 2016-10-24 LAB — ECHOCARDIOGRAM COMPLETE
HEIGHTINCHES: 63 in
WEIGHTICAEL: 3104 [oz_av]

## 2016-10-24 MED ORDER — PANTOPRAZOLE SODIUM 40 MG IV SOLR
40.0000 mg | Freq: Two times a day (BID) | INTRAVENOUS | Status: DC
  Administered 2016-10-24 – 2016-10-28 (×9): 40 mg via INTRAVENOUS
  Filled 2016-10-24 (×9): qty 40

## 2016-10-24 MED ORDER — FUROSEMIDE 10 MG/ML IJ SOLN
40.0000 mg | Freq: Once | INTRAMUSCULAR | Status: AC
  Administered 2016-10-24: 40 mg via INTRAVENOUS
  Filled 2016-10-24: qty 4

## 2016-10-24 MED ORDER — IPRATROPIUM-ALBUTEROL 0.5-2.5 (3) MG/3ML IN SOLN
3.0000 mL | Freq: Four times a day (QID) | RESPIRATORY_TRACT | Status: DC
  Administered 2016-10-24 – 2016-10-25 (×4): 3 mL via RESPIRATORY_TRACT
  Filled 2016-10-24 (×4): qty 3

## 2016-10-24 MED ORDER — FUROSEMIDE 10 MG/ML IJ SOLN
40.0000 mg | Freq: Once | INTRAMUSCULAR | Status: AC
Start: 2016-10-24 — End: 2016-10-24
  Administered 2016-10-24: 40 mg via INTRAVENOUS
  Filled 2016-10-24: qty 4

## 2016-10-24 MED ORDER — SODIUM CHLORIDE 0.9 % IV SOLN
Freq: Once | INTRAVENOUS | Status: AC
  Administered 2016-10-24: 12:00:00 via INTRAVENOUS

## 2016-10-24 MED ORDER — DIPHENHYDRAMINE HCL 50 MG/ML IJ SOLN
25.0000 mg | Freq: Once | INTRAMUSCULAR | Status: AC
  Administered 2016-10-24: 25 mg via INTRAVENOUS
  Filled 2016-10-24: qty 1

## 2016-10-24 MED ORDER — ETHACRYNIC ACID 25 MG PO TABS
50.0000 mg | ORAL_TABLET | Freq: Once | ORAL | Status: AC
  Administered 2016-10-24: 50 mg via ORAL
  Filled 2016-10-24: qty 2

## 2016-10-24 NOTE — Progress Notes (Signed)
Hgb 6.9, tachy 109; fine crackles, wheezing, SOB on exhertion; Dr. Burt Knack notified of findings and allergies; This RN consulted Shanon Brow, Pharmacist regarding diuretic and Sulfa allergy; Patient stated that she was told by Dr. Hall Busing once that she couldn't take Sulfa drugs, but she didn't know why; Pharmacist suggested Ethacrynic acid if MD wanted to order a diurectic;  Dr. Burt Knack made aware of this, he has deferred to Dr. Dahlia Byes later this am. Barbaraann Faster, RN 6:42 AM 10/24/2016

## 2016-10-24 NOTE — Progress Notes (Addendum)
Date: 10/24/2016,   MRN# [id number] BONITA BRINDISI 08/27/1950 Code Status:     Code Status Orders        Start     Ordered   10/21/16 1705  Full code  Continuous     10/21/16 1709    Code Status History    Date Active Date Inactive Code Status Order ID Comments User Context   This patient has a current code status but no historical code status.     Hosp day:@LENGTHOFSTAYDAYS @ Referring MD: @ATDPROV @     CurrentWeight: 194 lb (88 kg)   HPI: This am a little more sob, anxious and wet. Her h/h has fallen. She is to be transfused.   PMHX:   Past Medical History:  Diagnosis Date  . Anxiety   . Arthritis   . Bronchitis   . Difficult intubation   . Dyspnea    with exertion  . Family history of adverse reaction to anesthesia    Sister "died from anesthesia" many years ago during thyroid surgery  . History of hiatal hernia   . Motion sickness    cars  . Panic attacks    Surgical Hx:  Past Surgical History:  Procedure Laterality Date  . [employer]  . ESOPHAGOGASTRODUODENOSCOPY (EGD) WITH PROPOFOL N/A 09/18/2016   Procedure: ESOPHAGOGASTRODUODENOSCOPY (EGD) WITH PROPOFOL;  Surgeon: Lucilla Lame, MD;  Location: [place];  Service: Endoscopy;  Laterality: N/A;  . HIATAL HERNIA REPAIR N/A 10/21/2016   Procedure: LAPAROSCOPIC REPAIR OF HIATAL HERNIA;  Surgeon: Jules Husbands, MD;  Location: [facility];  Service: General;  Laterality: N/A;  . HIATAL HERNIA REPAIR N/A 10/21/2016   Procedure: HERNIA REPAIR HIATAL;  Surgeon: Jules Husbands, MD;  Location: [facility];  Service: General;  Laterality: N/A;  . TONSILLECTOMY     Family Hx:  Family History  Problem Relation Age of Onset  . Diabetes Mother   . Heart disease Mother   . COPD Father    Social Hx:   Social History  Substance Use Topics  . Smoking status: Former Smoker    Packs/day: 1.00    Years: 40.00    Types: Cigarettes    Quit date: 05/06/2016  . Smokeless tobacco: Never Used  . Alcohol use No    Medication:    Home Medication:    Current Medication: @CURMEDTAB @   Allergies:  Acetaminophen; Codeine; Sulfa antibiotics; and Sulfasalazine  Review of Systems: Gen:  Denies  fever, sweats, chills HEENT: Denies blurred vision, double vision, ear pain, eye pain, hearing loss, nose bleeds, sore throat Cvc:  No dizziness, chest pain or heaviness Resp:  Dyspnea on exertion, no pleurisy. Mild wheezing  Gi: Denies swallowing difficulty, stomach pain, nausea or vomiting, diarrhea, constipation, bowel incontinence Gu:  Denies bladder incontinence, burning urine Ext:   No Joint pain, stiffness or swelling Skin: No skin rash, easy bruising or bleeding or hives Endoc:  No polyuria, polydipsia , polyphagia or weight change Psych: No depression, insomnia or hallucinations  Other:  All other systems negative  Physical Examination:   VS: BP 113/69 (BP Location: Left Arm)   Pulse (!) 109   Temp 98.6 F (37 C) (Oral)   Resp 20   Ht 5\' 3"  (1.6 m)   Wt 194 lb (88 kg)   SpO2 96%   BMI 34.37 kg/m   General Appearance: No distress  Neuro/psych: without focal findings, mental status, speech normal, alert and oriented, cranial nerves 2-12 intact, reflexes normal and  symmetric, sensation grossly normal, bouts of anxiety.  NECK: No stridor, + ve jvd HEENT: PERRLA, EOM intact, no ptosis, no other lesions noticed, Mallampati: FundraisingSteps.no wheezing, basal crackles:   Cardiovascular:  Normal S1,S2.  No m/r/g.     Abdomen:Benign, Soft, non-tender, No masses,  Endoc: No evident thyromegaly, no signs of acromegaly or Cushing features Skin:   warm, no rashes, no ecchymosis  Extremities: normal, no cyanosis, clubbing, no edema, warm with normal capillary refill. No homan's sign    Labs results:   Recent Labs     10/21/16  1312  10/21/16  1625  10/21/16  1633  10/22/16  0520  10/23/16  0558  10/24/16  0517  HGB  9.9*  8.6*   --   8.2*   --   6.9*  HCT  29.9*  26.4*   --   25.1*   --    20.5*  MCV  90.1  92.4   --   89.9   --   91.0  WBC  5.5  6.1   --   9.4   --   9.8  BUN   --    --   12  14  20  19   CREATININE   --    --   0.80  0.96  0.99  0.90  GLUCOSE   --    --   180*  138*  132*  104*  CALCIUM   --    --   8.2*  8.0*  8.3*  8.3*  INR   --   1.21   --    --    --    --   ,    Rad results:  EXAM: PORTABLE CHEST 1 VIEW  COMPARISON:  10/21/2016  FINDINGS: Retrocardiac airspace opacity with blunting of the costophrenic angle. Mildly low lung volumes. Mild cardiomegaly.  Tubes project over the left upper quadrant, likely including a postoperative drain.  Interstitial accentuation has improved.  IMPRESSION: 1. Left basilar airspace opacity. By report, the hiatal hernia was completely reduced and accordingly this opacity is not felt to be due to hiatal hernia, but more likely left lower lobe atelectasis or pneumonia. Slight blunting of the left costophrenic angle, small pleural effusion not excluded. 2. Mild cardiomegaly. The interstitial accentuation in the lungs has improved.   Electronically Signed   By: Van Clines M.D.   On: 10/23/2016 08:50  IMPRESSION: chest ct scan 1. 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 Although this does not appear to be obstructive at this time, surgical consultation arch that nonemergent surgical consultation arch that nonemergent surgical consultation is recommended in the near future. 2. No other acute findings are noted in the abdomen or pelvis. 3. Cholelithiasis, without evidence of acute cholecystitis at this time. At least one of these gallstones appears adherent to the gallbladder wall. 4. Colonic diverticulosis without evidence of acute diverticulitis at this time. 5. There are calcifications of the aortic valve and anterior leaflet of the mitral valve. Echocardiographic correlation for  evaluation of potential valvular dysfunction may be warranted if clinically indicated. 6. Additional incidental findings, as above.   Electronically Signed   By: Vinnie Langton M.D.   On: 09/12/2016 15:46  Assessment and Plan: Hx of moderate copd, s/p open lap surgery to correct hiatus hernia repair. Suspect atelectasis, pulmonary edema ( ? Fluid overload, doubt high out put failure, vs/ or plus decrease  LVF). and bronchospasm. I also suspect a component of anxiety  -will wean down fio2 and keep sat 91-92 % ( down to 3.5 liters, sats 97 % when I checked) -agree with incentive spirometry, dvt prophylaxis, echo and lasix 40 mg bid -change albuterol to duo neb, continue pulmicort 0.5 bid via neb -per response will add solumedrol 40 mg tid -resume lexapro 20  mg q day -chest xray, cbc, bnp, bmp  in am -following with you    I have personally obtained a history, examined the patient, evaluated laboratory and imaging results, formulated the assessment and plan and placed orders.  The Patient requires high complexity decision making for assessment and support, frequent evaluation and titration of therapies, application of advanced monitoring technologies and extensive interpretation of multiple databases.   Zionah Criswell,M.D. Pulmonary & Critical care Medicine [facility]

## 2016-10-24 NOTE — Progress Notes (Signed)
POD # 3 Main issues is respiratory Now W JVD  Unsure if aldactone is working Hb 6.9 VSS HR 100-110 Taking liquids and reports no abd pain Creat ok, BUN going down.  PE on [place] non toxic NEck: + JVD even at 45 degree Chest: shallow breathing, some crackles Abd: staples in place, incisions c/d/i no infection or peritonitis  A/P needs aggressive diuresis, despita ? Allergy to sulfa we need to be able to use a parenteral diuretic for her pulm edema. D/W the pt in detail I will order Echo to evaluate LVfx and give blood transfusion slowly Clinically no evidence of ongoing hemorrhage, drop in hb likely dilutional and from fluid overload as evidence on PE by JVD D/W nursing staff in detail

## 2016-10-24 NOTE — Progress Notes (Signed)
*  PRELIMINARY RESULTS* Echocardiogram 2D Echocardiogram has been performed.  Yvette Jackson 10/24/2016, 2:46 PM

## 2016-10-24 NOTE — Progress Notes (Signed)
Patient SOB at rest, requested breathing treatment; Respiratory in with SVN; fine crackles, exp. Wheezing; IVF at 43ml/hour; patient states that she has never been told that she had COPD; IS used poorly. Barbaraann Faster, RN 4:01 AM 10/24/2016

## 2016-10-25 ENCOUNTER — Inpatient Hospital Stay: Payer: 59

## 2016-10-25 LAB — CBC
HCT: 24.9 % — ABNORMAL LOW (ref 35.0–47.0)
Hemoglobin: 8.4 g/dL — ABNORMAL LOW (ref 12.0–16.0)
MCH: 29.9 pg (ref 26.0–34.0)
MCHC: 33.5 g/dL (ref 32.0–36.0)
MCV: 89.3 fL (ref 80.0–100.0)
PLATELETS: 183 10*3/uL (ref 150–440)
RBC: 2.79 MIL/uL — ABNORMAL LOW (ref 3.80–5.20)
RDW: 15.5 % — AB (ref 11.5–14.5)
WBC: 8.3 10*3/uL (ref 3.6–11.0)

## 2016-10-25 LAB — BASIC METABOLIC PANEL
Anion gap: 9 (ref 5–15)
BUN: 24 mg/dL — AB (ref 6–20)
CALCIUM: 8.6 mg/dL — AB (ref 8.9–10.3)
CO2: 29 mmol/L (ref 22–32)
CREATININE: 1.1 mg/dL — AB (ref 0.44–1.00)
Chloride: 104 mmol/L (ref 101–111)
GFR calc Af Amer: >60 mL/min (ref >60)
GFR, EST NON AFRICAN AMERICAN: 52 mL/min — AB (ref >60)
Glucose, Bld: 112 mg/dL — ABNORMAL HIGH (ref 65–99)
Potassium: 3.3 mmol/L — ABNORMAL LOW (ref 3.5–5.1)
SODIUM: 142 mmol/L (ref 135–145)

## 2016-10-25 LAB — BRAIN NATRIURETIC PEPTIDE: B Natriuretic Peptide: 69 pg/mL (ref 0.0–100.0)

## 2016-10-25 MED ORDER — ACETAZOLAMIDE ER 500 MG PO CP12
500.0000 mg | ORAL_CAPSULE | Freq: Two times a day (BID) | ORAL | Status: DC
  Filled 2016-10-25: qty 1

## 2016-10-25 MED ORDER — POTASSIUM CHLORIDE CRYS ER 20 MEQ PO TBCR
20.0000 meq | EXTENDED_RELEASE_TABLET | Freq: Two times a day (BID) | ORAL | Status: AC
  Administered 2016-10-25 (×2): 20 meq via ORAL
  Filled 2016-10-25 (×2): qty 1

## 2016-10-25 MED ORDER — IPRATROPIUM-ALBUTEROL 0.5-2.5 (3) MG/3ML IN SOLN
3.0000 mL | Freq: Two times a day (BID) | RESPIRATORY_TRACT | Status: DC
  Administered 2016-10-25 – 2016-10-28 (×6): 3 mL via RESPIRATORY_TRACT
  Filled 2016-10-25 (×7): qty 3

## 2016-10-25 MED ORDER — ACETAZOLAMIDE 250 MG PO TABS
500.0000 mg | ORAL_TABLET | Freq: Two times a day (BID) | ORAL | Status: AC
  Administered 2016-10-25 (×2): 500 mg via ORAL
  Filled 2016-10-25 (×2): qty 2

## 2016-10-25 NOTE — Evaluation (Signed)
Physical Therapy Evaluation Patient Details Name: Yvette Jackson MRN: [id number] DOB: 1950/05/15 Today's Date: 10/25/2016   History of Present Illness  66 yo female with onset of HH repair due to risk of incarceration of stomach in hernia.  Has pleural effusion, shallow lung inflation, cardiomegaly, with PMHx:  bronchitis, dyspnea, COPD  Clinical Impression  Pt is walking with PT for short trips in her room and needs assistance with O2 line and safety of navigation in the room.  Her plan is to work on strength and standing control along with gait, progress as needed and transition to HHPT for further work on same.  Her motivation is good but needs to be assisted to monitor vitals with pulse 108 at rest and 118 after walk.  IF not controlled by DC then recommend reconsidering for SNF.    Follow Up Recommendations Home health PT;Supervision for mobility/OOB    Equipment Recommendations  None recommended by PT    Recommendations for Other Services       Precautions / Restrictions Precautions Precautions: Fall Restrictions Weight Bearing Restrictions: No      Mobility  Bed Mobility Overal bed mobility: Needs Assistance Bed Mobility: Supine to Sit     Supine to sit: Min assist     General bed mobility comments: helped by supporting trunk and assisting minimally to scoot out to EOB  Transfers Overall transfer level: Needs assistance Equipment used: Rolling walker (2 wheeled);1 person hand held assist Transfers: Sit to/from Stand Sit to Stand: Min assist         General transfer comment: minor power up help and pt can steady thereafter  Ambulation/Gait Ambulation/Gait assistance: Min guard Ambulation Distance (Feet): 45 Feet Assistive device: Rolling walker (2 wheeled);1 person hand held assist Gait Pattern/deviations: Step-through pattern;Decreased stride length;Wide base of support;Shuffle;Trunk flexed Gait velocity: slow Gait velocity interpretation: Below normal speed  for age/gender General Gait Details: guarded gait with O2 line to extend and cover needs  Stairs            Wheelchair Mobility    Modified Rankin (Stroke Patients Only)       Balance Overall balance assessment: Needs assistance Sitting-balance support: Feet supported Sitting balance-Leahy Scale: Good     Standing balance support: Bilateral upper extremity supported Standing balance-Leahy Scale: Fair Standing balance comment: less than fair with dynamic standing                             Pertinent Vitals/Pain Pain Assessment: Faces Faces Pain Scale: Hurts little more Pain Location: abdomen and chest Pain Descriptors / Indicators: Operative site guarding Pain Intervention(s): Limited activity within patient's tolerance;Monitored during session;Premedicated before session;Repositioned    Home Living Family/patient expects to be discharged to:: Private residence Living Arrangements: Alone Available Help at Discharge: Family;Available PRN/intermittently Type of Home: House Home Access: Stairs to enter   Entrance Stairs-Number of Steps: 13 Home Layout: One level Home Equipment: None Additional Comments: was not needing any AD previously    Prior Function Level of Independence: Independent               Hand Dominance        Extremity/Trunk Assessment   Upper Extremity Assessment Upper Extremity Assessment: Overall WFL for tasks assessed    Lower Extremity Assessment Lower Extremity Assessment: Overall WFL for tasks assessed    Cervical / Trunk Assessment Cervical / Trunk Assessment: Normal  Communication   Communication: No difficulties  Cognition  Arousal/Alertness: Awake/alert Behavior During Therapy: WFL for tasks assessed/performed Overall Cognitive Status: Within Functional Limits for tasks assessed                                        General Comments      Exercises     Assessment/Plan    PT  Assessment Patient needs continued PT services  PT Problem List Decreased strength;Decreased range of motion;Decreased activity tolerance;Decreased balance;Decreased mobility;Decreased coordination;Decreased knowledge of use of DME;Decreased safety awareness;Decreased knowledge of precautions;Cardiopulmonary status limiting activity;Obesity;Decreased skin integrity;Pain (had pulse 108 pregait and 118 post gait)       PT Treatment Interventions DME instruction;Gait training;Stair training;Functional mobility training;Therapeutic activities;Therapeutic exercise;Balance training;Neuromuscular re-education;Patient/family education    PT Goals (Current goals can be found in the Care Plan section)  Acute Rehab PT Goals Patient Stated Goal: to hurt less and walk a little PT Goal Formulation: With patient Time For Goal Achievement: 11/08/16 Potential to Achieve Goals: Good    Frequency Min 2X/week   Barriers to discharge Decreased caregiver support home independently    Co-evaluation               AM-PAC PT "6 Clicks" Daily Activity  Outcome Measure Difficulty turning over in bed (including adjusting bedclothes, sheets and blankets)?: A Little Difficulty moving from lying on back to sitting on the side of the bed? : Total Difficulty sitting down on and standing up from a chair with arms (e.g., wheelchair, bedside commode, etc,.)?: Total Help needed moving to and from a bed to chair (including a wheelchair)?: A Little Help needed walking in hospital room?: A Little Help needed climbing 3-5 steps with a railing? : A Lot 6 Click Score: 13    End of Session Equipment Utilized During Treatment: Gait belt;Oxygen Activity Tolerance: Patient tolerated treatment well;Patient limited by fatigue Patient left: in chair;with call bell/phone within reach;with chair alarm set Nurse Communication: Mobility status PT Visit Diagnosis: Unsteadiness on feet (R26.81);Muscle weakness (generalized)  (M62.81);Difficulty in walking, not elsewhere classified (R26.2)    Time: 9892-1194 PT Time Calculation (min) (ACUTE ONLY): 42 min   Charges:   PT Evaluation $PT Eval Moderate Complexity: 1 Procedure PT Treatments $Gait Training: 8-22 mins $Therapeutic Activity: 8-22 mins   PT G Codes:   PT G-Codes **NOT FOR INPATIENT CLASS** Functional Assessment Tool Used: AM-PAC 6 Clicks Basic Mobility   Ramond Dial 10/25/2016, 1:20 PM   Mee Hives, PT MS Acute Rehab Dept. Number: [place] and [place]

## 2016-10-25 NOTE — Progress Notes (Signed)
POD # 4 Much improved last 24 hrs Greater 3 lts from UO after aggressive diuresis Creat ok Hb 8.4 adequate response to RBC Echo good fx, pleural effusion Taking full liquids  PE NAD Chest: more clear this am, decrease bs bases Abd: soft, nt incision c/d/i, no infection or peritontiis   A/p Doing well Mobilize pulm toilet Continue current diet

## 2016-10-26 LAB — TYPE AND SCREEN
ABO/RH(D): O POS
ANTIBODY SCREEN: NEGATIVE
UNIT DIVISION: 0
Unit division: 0

## 2016-10-26 LAB — BPAM RBC
BLOOD PRODUCT EXPIRATION DATE: 201807252359
Blood Product Expiration Date: 201807252359
ISSUE DATE / TIME: 201807061226
UNIT TYPE AND RH: 5100
Unit Type and Rh: 5100

## 2016-10-26 LAB — BASIC METABOLIC PANEL
Anion gap: 8 (ref 5–15)
BUN: 20 mg/dL (ref 6–20)
CALCIUM: 8.9 mg/dL (ref 8.9–10.3)
CHLORIDE: 105 mmol/L (ref 101–111)
CO2: 28 mmol/L (ref 22–32)
CREATININE: 1.1 mg/dL — AB (ref 0.44–1.00)
GFR, EST NON AFRICAN AMERICAN: 52 mL/min — AB (ref >60)
Glucose, Bld: 119 mg/dL — ABNORMAL HIGH (ref 65–99)
Potassium: 3.5 mmol/L (ref 3.5–5.1)
SODIUM: 141 mmol/L (ref 135–145)

## 2016-10-26 LAB — CBC
HEMATOCRIT: 26.4 % — AB (ref 35.0–47.0)
Hemoglobin: 8.8 g/dL — ABNORMAL LOW (ref 12.0–16.0)
MCH: 30.4 pg (ref 26.0–34.0)
MCHC: 33.2 g/dL (ref 32.0–36.0)
MCV: 91.6 fL (ref 80.0–100.0)
PLATELETS: 181 10*3/uL (ref 150–440)
RBC: 2.88 MIL/uL — ABNORMAL LOW (ref 3.80–5.20)
RDW: 15.3 % — AB (ref 11.5–14.5)
WBC: 6.9 10*3/uL (ref 3.6–11.0)

## 2016-10-26 MED ORDER — HEPARIN SODIUM (PORCINE) 5000 UNIT/ML IJ SOLN
5000.0000 [IU] | Freq: Three times a day (TID) | INTRAMUSCULAR | Status: DC
  Administered 2016-10-26 – 2016-10-28 (×5): 5000 [IU] via SUBCUTANEOUS
  Filled 2016-10-26 (×6): qty 1

## 2016-10-26 NOTE — Progress Notes (Signed)
POD # 5 Hb 8.8 AVSS  PE NAD, still has some [place] O2 Chest: decrease bases, NSR Abd: staples in place, no infection or peritonitis.  A/p Mobilize Soft diet pulm toilet Start heparin proph

## 2016-10-27 LAB — CBC
HEMATOCRIT: 31.3 % — AB (ref 35.0–47.0)
Hemoglobin: 10.4 g/dL — ABNORMAL LOW (ref 12.0–16.0)
MCH: 29.9 pg (ref 26.0–34.0)
MCHC: 33.3 g/dL (ref 32.0–36.0)
MCV: 89.9 fL (ref 80.0–100.0)
PLATELETS: 191 10*3/uL (ref 150–440)
RBC: 3.48 MIL/uL — ABNORMAL LOW (ref 3.80–5.20)
RDW: 15.1 % — AB (ref 11.5–14.5)
WBC: 7.3 10*3/uL (ref 3.6–11.0)

## 2016-10-27 LAB — PREPARE RBC (CROSSMATCH)

## 2016-10-27 MED ORDER — SODIUM CHLORIDE 0.9 % IV SOLN
Freq: Once | INTRAVENOUS | Status: AC
  Administered 2016-10-27: 12:00:00 via INTRAVENOUS

## 2016-10-27 MED ORDER — GUAIFENESIN-DM 100-10 MG/5ML PO SYRP
5.0000 mL | ORAL_SOLUTION | ORAL | Status: DC | PRN
  Administered 2016-10-27: 5 mL via ORAL
  Filled 2016-10-27: qty 5

## 2016-10-27 NOTE — Progress Notes (Signed)
Rounding unit, [place] visited pt who was lying on the bed at the time of this visit. Pt indicated she is dawn and anxious because of changes in her health status since she had surgery. Pt talked with [place] briefly and asked for prayers. [place] encouraged pt and offered prayers for healing and comfort. [place] will make a follow up visit with pt as needed.   10/27/16 1400  Clinical Encounter Type  Visited With Patient  Visit Type Initial  Referral From [place];Other (Comment)

## 2016-10-27 NOTE — Progress Notes (Signed)
PT Attempt Note  Patient Details Name: Yvette Jackson MRN: [id number] DOB: 11-Jun-1950   Attempt Treatment:    Reason Eval/Treat Not Completed: Medical issues which prohibited therapy. Chart reviewed. Attempted to see patient at 1150 however she is waiting to receive a transfusion. Will attempt PT treatment on later date/time as pt is appropriate.  Yvette Jackson PT, DPT   Yvette Jackson 10/27/2016, 1:29 PM

## 2016-10-27 NOTE — Progress Notes (Signed)
[facility] Hospital Day(s): 6.   Post op day(s): 6 Days Post-Op.   Interval History: Patient seen and examined, no acute events or new complaints overnight. Patient reports mild peri-incisional abdominal pain well-controlled with +flatus, +BM, and tolerating diet, denies N/V, fever/chills, or CP, though desaturates to 80's on room air.  Review of Systems:  Constitutional: denies fever, chills  HEENT: denies cough or congestion  Respiratory: denies any shortness of breath  Cardiovascular: denies chest pain or palpitations  Gastrointestinal: abdominal pain, N/V, and bowel function as per interval history Genitourinary: denies burning with urination or urinary frequency Musculoskeletal: denies pain, decreased motor or sensation Integumentary: denies any other rashes or skin discolorations Neurological: denies HA or vision/hearing changes   Vital signs in last 24 hours: [min-max] current  Temp:  [98 F (36.7 C)-99.5 F (37.5 C)] 98 F (36.7 C) (07/09 0417) Pulse Rate:  [91-102] 91 (07/09 0417) Resp:  [16-20] 20 (07/09 0417) BP: (100-118)/(61-83) 118/83 (07/09 0417) SpO2:  [96 %-100 %] 98 % (07/09 0417)     Height: 5\' 3"  (160 cm) Weight: 194 lb (88 kg) BMI (Calculated): 34.4   Intake/Output this shift:  No intake/output data recorded.   Intake/Output last 2 shifts:  @IOLAST2SHIFTS @   Physical Exam:  Constitutional: alert, cooperative and no distress  HENT: normocephalic without obvious abnormality  Eyes: PERRL, EOM's grossly intact and symmetric  Neuro: CN II - XII grossly intact and symmetric without deficit  Respiratory: breathing non-labored at rest  Cardiovascular: regular rate and sinus rhythm  Gastrointestinal: soft, non-tender, and non-distended, incisions well-approximated without erythema or drainage Musculoskeletal: UE and LE FROM, no edema or wounds, motor and sensation grossly intact, NT   Labs:  CBC Latest Ref Rng & Units 10/26/2016 10/25/2016  10/24/2016  WBC 3.6 - 11.0 K/uL 6.9 8.3 9.8  Hemoglobin 12.0 - 16.0 g/dL 8.8(L) 8.4(L) 6.9(L)  Hematocrit 35.0 - 47.0 % 26.4(L) 24.9(L) 20.5(L)  Platelets 150 - 440 K/uL 181 183 172   CMP Latest Ref Rng & Units 10/26/2016 10/25/2016 10/24/2016  Glucose 65 - 99 mg/dL 119(H) 112(H) 104(H)  BUN 6 - 20 mg/dL 20 24(H) 19  Creatinine 0.44 - 1.00 mg/dL 1.10(H) 1.10(H) 0.90  Sodium 135 - 145 mmol/L 141 142 141  Potassium 3.5 - 5.1 mmol/L 3.5 3.3(L) 4.1  Chloride 101 - 111 mmol/L 105 104 109  CO2 22 - 32 mmol/L 28 29 25   Calcium 8.9 - 10.3 mg/dL 8.9 8.6(L) 8.3(L)  Total Protein 6.5 - 8.1 g/dL - - -  Total Bilirubin 0.3 - 1.2 mg/dL - - -  Alkaline Phos 38 - 126 U/L - - -  AST 15 - 41 U/L - - -  ALT 14 - 54 U/L - - -   Imaging studies: No new pertinent imaging studies available for review   Assessment/Plan: 66 y.o. female with slowly improving hypoxia attributed to chronic COPD, not pre-operatively requiring home O2, 6 Days Post-Op s/p open repair of hiatal hernia, complicated by pertinent comorbidities including GERD, COPD, and chronic tobacco abuse.   - wean O2  - continue soft diet as tolerated  - discharge planning for home vs SNF +/- O2 via [place]  - ambulation encouraged  - DVT prophylaxis  All of the above findings and recommendations were discussed with the patient, and all of patient's questions were answered to her expressed satisfaction.  -- Marilynne Drivers Rosana Hoes, MD, [place]: [facility] - Partnering for exceptional care. Office: [phone]

## 2016-10-27 NOTE — Progress Notes (Signed)
.  hf

## 2016-10-27 NOTE — Progress Notes (Signed)
Patient had one unit of blood transfusion. Md cancelled the second unit.

## 2016-10-27 NOTE — Progress Notes (Signed)
Date: 10/27/2016,   MRN# [id number] Yvette Jackson [birth year] Code Status:     Code Status Orders        Start     Ordered   10/21/16 1705  Full code  Continuous     10/21/16 1709    Code Status History    Date Active Date Inactive Code Status Order ID Comments User Context   This patient has a current code status but no historical code status.     Hosp day:@LENGTHOFSTAYDAYS @ Referring MD: @ATDPROV @      HPI: improved over the week end, fio2 down, echo is good, small pleuural effusions. Sob not a complaint this pm. Healing well post op. blood transfusion given.   PMHX:   Past Medical History:  Diagnosis Date  . Anxiety   . Arthritis   . Bronchitis   . Difficult intubation   . Dyspnea    with exertion  . Family history of adverse reaction to anesthesia    Sister "died from anesthesia" many years ago during thyroid surgery  . History of hiatal hernia   . Motion sickness    cars  . Panic attacks    Surgical Hx:  Past Surgical History:  Procedure Laterality Date  . [place]  . ESOPHAGOGASTRODUODENOSCOPY (EGD) WITH PROPOFOL N/A 09/18/2016   Procedure: ESOPHAGOGASTRODUODENOSCOPY (EGD) WITH PROPOFOL;  Surgeon: Lucilla Lame, MD;  Location: [facility];  Service: Endoscopy;  Laterality: N/A;  . HIATAL HERNIA REPAIR N/A 10/21/2016   Procedure: LAPAROSCOPIC REPAIR OF HIATAL HERNIA;  Surgeon: Jules Husbands, MD;  Location: [facility];  Service: General;  Laterality: N/A;  . HIATAL HERNIA REPAIR N/A 10/21/2016   Procedure: HERNIA REPAIR HIATAL;  Surgeon: Jules Husbands, MD;  Location: [facility];  Service: General;  Laterality: N/A;  . TONSILLECTOMY     Family Hx:  Family History  Problem Relation Age of Onset  . Diabetes Mother   . Heart disease Mother   . COPD Father    Social Hx:   Social History  Substance Use Topics  . Smoking status: Former Smoker    Packs/day: 1.00    Years: 40.00    Types: Cigarettes    Quit date: 05/06/2016  . Smokeless tobacco:  Never Used  . Alcohol use No   Medication:    Home Medication:    Current Medication: @CURMEDTAB @   Allergies:  Acetaminophen; Codeine; Sulfa antibiotics; and Sulfasalazine  Review of Systems: Gen:  Denies  fever, sweats, chills HEENT: Denies blurred vision, double vision, ear pain, eye pain, hearing loss, nose bleeds, sore throat Cvc:  No dizziness, chest pain or heaviness Resp:    Gi: Denies swallowing difficulty, stomach pain, nausea or vomiting, diarrhea, constipation, bowel incontinence Gu:  Denies bladder incontinence, burning urine Ext:   No Joint pain, stiffness or swelling Skin: No skin rash, easy bruising or bleeding or hives Endoc:  No polyuria, polydipsia , polyphagia or weight change Psych: No depression, insomnia or hallucinations  Other:  All other systems negative  Physical Examination:   VS: BP (!) 145/75 (BP Location: Right Arm)   Pulse 86   Temp 98.3 F (36.8 C) (Axillary)   Resp 18   Ht 5\' 3"  (1.6 m)   Wt 194 lb (88 kg)   SpO2 98%   BMI 34.37 kg/m   General Appearance: No distress  Neuro: without focal findings, mental status, speech normal, alert and oriented, cranial nerves 2-12 intact, reflexes normal and symmetric, sensation grossly normal  HEENT: PERRLA, EOM intact, no ptosis, no other lesions noticed, Mallampati: Pulmonary:.No wheezing, No rales  Sputum Production:   Cardiovascular:  Normal S1,S2.  No m/r/g.  Abdominal aorta pulsation normal.    Abdomen:Benign, Soft, non-tender, No masses, hepatosplenomegaly, No lymphadenopathy Endoc: No evident thyromegaly, no signs of acromegaly or Cushing features Skin:   warm, no rashes, no ecchymosis  Extremities: normal, no cyanosis, clubbing, no edema, warm with normal capillary refill. Other findings:   Labs results:   Recent Labs     10/25/16  0434  10/26/16  0445  HGB  8.4*  8.8*  HCT  24.9*  26.4*  MCV  89.3  91.6  WBC  8.3  6.9  BUN  24*  20  CREATININE  1.10*  1.10*  GLUCOSE  112*   119*  CALCIUM  8.6*  8.9  ,      Rad results:  EXAM: PORTABLE CHEST 1 VIEW  COMPARISON:  Prior radiograph from 10/23/2016.  FINDINGS: Stable cardiomegaly.  Mediastinal silhouette within normal limits.  Lungs hypoinflated. Mild perihilar vascular congestion with interstitial extent to a shin, similar to previous. Dense retrocardiac left lower lobe opacity, similar to previous. Patchy opacity at the medial right lung base, slightly progressed from previous. Mild blunting of the bilateral costophrenic angles, suggestive of small effusions. No pneumothorax.  Osseous structures unchanged.  IMPRESSION: 1. Shallow lung inflation with persistent bibasilar opacities, slightly worsened on the right as compared to previous. Findings may reflect atelectasis or infiltrates. 2. Probable small bilateral pleural effusions. 3. Stable cardiomegaly with mild pulmonary interstitial congestion.   Electronically Signed   By: Jeannine Boga M.D.   On: 10/25/2016 06:31   Assessment and Plan: Hx of moderate copd, s/p open lap surgery to correct hiatus hernia repair, healing well. S/p blood transfusion today. No new pilm sxs. Not much sob today at rest.  Oxygen demand is decreasing, down to 2 liters.  . -continue to  wean down fio2 and keep sat 91-92 % ( down to 3.5 liters, sats 97 % when I checked) -incentive spirometry, dvt prophylaxis, -duo neb, continue pulmicort 0.5 bid via neb -chest xray,   in am - ? Rehab, physical tx     I have personally obtained a history, examined the patient, evaluated laboratory and imaging results, formulated the assessment and plan and placed orders.  The Patient requires high complexity decision making for assessment and support, frequent evaluation and titration of therapies, application of advanced monitoring technologies and extensive interpretation of multiple databases.   Keyira Mondesir,M.D. Pulmonary & Critical care Medicine [facility]

## 2016-10-28 ENCOUNTER — Inpatient Hospital Stay: Payer: 59

## 2016-10-28 LAB — TYPE AND SCREEN
ABO/RH(D): O POS
ANTIBODY SCREEN: NEGATIVE
Unit division: 0

## 2016-10-28 LAB — CBC
HCT: 28.5 % — ABNORMAL LOW (ref 35.0–47.0)
Hemoglobin: 9.7 g/dL — ABNORMAL LOW (ref 12.0–16.0)
MCH: 31 pg (ref 26.0–34.0)
MCHC: 34 g/dL (ref 32.0–36.0)
MCV: 91.2 fL (ref 80.0–100.0)
PLATELETS: 183 10*3/uL (ref 150–440)
RBC: 3.13 MIL/uL — ABNORMAL LOW (ref 3.80–5.20)
RDW: 15.2 % — AB (ref 11.5–14.5)
WBC: 6.5 10*3/uL (ref 3.6–11.0)

## 2016-10-28 LAB — BPAM RBC
Blood Product Expiration Date: 201807252359
ISSUE DATE / TIME: 201807091134
Unit Type and Rh: 5100

## 2016-10-28 MED ORDER — OMEPRAZOLE MAGNESIUM 20 MG PO TBEC: 20.0000 mg | DELAYED_RELEASE_TABLET | Freq: Every day | ORAL | 0 refills | Status: DC

## 2016-10-28 MED ORDER — OXYCODONE HCL 5 MG/5ML PO SOLN: 5.0000 mg | ORAL | 0 refills | Status: DC | PRN

## 2016-10-28 NOTE — Progress Notes (Signed)
Physical Therapy Treatment Patient Details Name: Yvette Jackson MRN: [id number] DOB: 1950/09/07 Today's Date: 10/28/2016    History of Present Illness 66 yo female with onset of HH repair due to risk of incarceration of stomach in hernia.  Has pleural effusion, shallow lung inflation, cardiomegaly, with PMHx:  bronchitis, dyspnea, COPD    PT Comments    Pt in chair, hoping to go home today.  Stood and was able to ambulate around unit this pm with walker and min guard/supervision.  Overall does well but gait remains slow with short steps.  Reports she does not have a walker at home but would feel more comfortable having one upon discharge until she is stronger.  Agreed.  HR 113 after gait O2 96% on room air.     Follow Up Recommendations  Home health PT;Supervision for mobility/OOB     Equipment Recommendations  Rolling walker with 5" wheels    Recommendations for Other Services       Precautions / Restrictions Precautions Precautions: Fall Restrictions Weight Bearing Restrictions: No    Mobility  Bed Mobility               General bed mobility comments: in chair  Transfers Overall transfer level: Needs assistance Equipment used: Rolling walker (2 wheeled);1 person hand held assist Transfers: Sit to/from Stand Sit to Stand: Supervision;Min guard            Ambulation/Gait Ambulation/Gait assistance: Min guard;Supervision Ambulation Distance (Feet): 200 Feet Assistive device: Rolling walker (2 wheeled) Gait Pattern/deviations: Step-through pattern;Decreased step length - right;Decreased step length - left Gait velocity: slow Gait velocity interpretation: Below normal speed for age/gender     Stairs            Wheelchair Mobility    Modified Rankin (Stroke Patients Only)       Balance Overall balance assessment: Needs assistance Sitting-balance support: Feet supported Sitting balance-Leahy Scale: Good     Standing balance support: Bilateral  upper extremity supported Standing balance-Leahy Scale: Fair                              Cognition Arousal/Alertness: Awake/alert Behavior During Therapy: WFL for tasks assessed/performed Overall Cognitive Status: Within Functional Limits for tasks assessed                                        Exercises      General Comments        Pertinent Vitals/Pain Pain Assessment: No/denies pain Pain Location: reports no pain today    Home Living                      Prior Function            PT Goals (current goals can now be found in the care plan section) Progress towards PT goals: Progressing toward goals    Frequency    Min 2X/week      PT Plan Current plan remains appropriate    Co-evaluation              AM-PAC PT "6 Clicks" Daily Activity  Outcome Measure  Difficulty turning over in bed (including adjusting bedclothes, sheets and blankets)?: A Little Difficulty moving from lying on back to sitting on the side of the bed? : Total Difficulty sitting down on and  standing up from a chair with arms (e.g., wheelchair, bedside commode, etc,.)?: A Little Help needed moving to and from a bed to chair (including a wheelchair)?: A Little Help needed walking in hospital room?: A Little Help needed climbing 3-5 steps with a railing? : A Little 6 Click Score: 16    End of Session Equipment Utilized During Treatment: Gait belt Activity Tolerance: Patient tolerated treatment well;No increased pain Patient left: in chair;with chair alarm set;with call bell/phone within reach Nurse Communication: Other (comment)       Time: 4604-7998 PT Time Calculation (min) (ACUTE ONLY): 14 min  Charges:  $Gait Training: 8-22 mins                    G Codes:       Chesley Noon, PTA 10/28/16, 12:50 PM

## 2016-10-28 NOTE — Progress Notes (Signed)
Patient given discharge instructions as ordered, and prescriptions as ordered. Patient Telemetry and IV discontinued site clean dry and intact. Walker delivered to the bedside. Patient family will pick her up at 1900 this evening. Patient is alert and oriented, no acute distress noted.

## 2016-10-28 NOTE — Care Management (Signed)
Patient admitted with hernia repair and slowly improving hypoxia attributed to chronic COPD.  Patient lives at home alone.  Her brother lives locally for support.  PCP [facility].  Pharmacy [facility].  Patient has been weaned to RA and does not qualify for home O2.  Home health orders have been placed for RN and PT.  Patient was provided with agency preference.  Patient states that she does not have a preference.  Referral made to [facility] with [facility].  RW to be delivered to room prior to discharge.  RNCM signing off

## 2016-10-28 NOTE — Progress Notes (Addendum)
SATURATION QUALIFICATIONS: (This note is used to comply with regulatory documentation for home oxygen)  Patient Saturations on Room Air at Rest = 97%  Patient Saturations on Room Air while Ambulating = 94%  Patient Saturations on [place] of oxygen while Ambulating = 94%  Please briefly explain why patient needs home oxygen Patient does not qualify for home Oxygen

## 2016-10-30 ENCOUNTER — Telehealth: Payer: Self-pay

## 2016-10-30 NOTE — Telephone Encounter (Signed)
Home Health Nurse has called to explain that she has examined patient today for first visit. Patient states that she has not had a bowel movement for 9+ days. She was instructed to give patient 1 entire bottle of magnesium citrate and if no bowel movement within 6 hours after, she is to do 2 fleets enemas back to back. Furthermore, she is asked to call the office if she goes more than 24 hours after this medication without a bowel movement. She verbalizes understanding and will let patient know.

## 2016-11-05 ENCOUNTER — Other Ambulatory Visit: Payer: Self-pay | Admitting: Surgery

## 2016-11-05 ENCOUNTER — Encounter: Payer: Self-pay | Admitting: Surgery

## 2016-11-05 ENCOUNTER — Ambulatory Visit (INDEPENDENT_AMBULATORY_CARE_PROVIDER_SITE_OTHER): Payer: 59 | Admitting: Surgery

## 2016-11-05 VITALS — BP 126/88 | HR 101 | Temp 97.6°F | Ht 63.0 in | Wt 184.8 lb

## 2016-11-05 DIAGNOSIS — Z4889 Encounter for other specified surgical aftercare: Secondary | ICD-10-CM

## 2016-11-05 DIAGNOSIS — M25562 Pain in left knee: Secondary | ICD-10-CM

## 2016-11-05 NOTE — Patient Instructions (Addendum)
We have scheduled your ultrasound for Friday at 9:45AM at [facility] will need to check in at the registration desk. Go down to the 1st floor and take a slight left. The very first desk on the right hand side is the registration desk.  We will see you back in office as listed below.

## 2016-11-05 NOTE — Progress Notes (Signed)
Surgical Clinic Progress/Follow-up Note   HPI:  66 y.o. Female presents to clinic for post-op follow-up evaluation 2 weeks s/p open repair of symptomatic hiatal hernia. Patient reports her breathing has continued to improve since she nearly required to be discharged home from the hospital post-op with home O2 for her chronic COPD, denies any SOB, GERD, N/V, CP, or fever/chills. She also states she's been tolerating her regular diet with moderate appetite and +flatus and +BM WNL. Her RLE edema has resolved, but NT LLE edema persists.  Review of Systems:  Constitutional: denies any other weight loss, fever, chills, or sweats  Eyes: denies any other vision changes, history of eye injury  ENT: denies sore throat, hearing problems  Respiratory: denies shortness of breath, wheezing  Cardiovascular: denies chest pain, palpitations  Gastrointestinal: abdominal pain, N/V, and bowel function as per HPI Musculoskeletal: denies any other joint pains or cramps  Skin: Denies any other rashes or skin discolorations except post-surgical abdominal wounds Neurological: denies any other headache, dizziness, weakness  Psychiatric: denies any other depression, anxiety  All other review of systems: otherwise negative   Vital Signs:  BP 126/88   Pulse (!) 101   Temp 97.6 F (36.4 C) (Oral)   Ht 5\' 3"  (1.6 m)   Wt 184 lb 12.8 oz (83.8 kg)   BMI 32.74 kg/m    Physical Exam:  Constitutional:  -- Obese body habitus  -- Awake, alert, and oriented x3  Eyes:  -- Pupils equally round and reactive to light  -- No scleral icterus  Ear, nose, throat:  -- No jugular venous distension  -- No nasal drainage, bleeding Pulmonary:  -- No crackles -- Equal breath sounds bilaterally -- Breathing non-labored at rest Cardiovascular:  -- S1, S2 present  -- No pericardial rubs  Gastrointestinal:  -- Soft, completely NT, and non-distended with incisions well-approximated without erythema or drainage, no  guarding/rebound -- Midline upper abdominal laparotomy incision still able to be slightly spread apart with mild tension -- No abdominal masses appreciated, pulsatile or otherwise  Musculoskeletal / Integumentary:  -- Wounds or skin discoloration: None except post-surgical abdominal wounds as described above (GI)  -- Extremities: B/L UE and LE FROM, hands and feet warm, unilateral 2+ LLE edema, NT to palation Neurologic:  -- Motor function: intact and symmetric  -- Sensation: intact and symmetric   Assessment:  66 y.o. yo Female with a problem list including...  Patient Active Problem List   Diagnosis Date Noted  . H/O hiatal hernia 10/21/2016  . Anxiety 10/14/2016  . Abdominal pain, epigastric   . Hiatal hernia   . Bronchitis 11/02/2013    presents to clinic for post-op follow-up evaluation, doing overall well s/p open repair of symptomatic hiatal hernia, complicated by comorbidities including COPD, almost requiring discharge home with home O2.  Plan:   - check LLE venous duplex to assess for DVT   - every other staple removed from midline incision, steri-strips applied  - continue to advance diet as tolerated, okay to submerge incisions under water prn  - return to clinic in 1 week, instructed to call office if any questions or concerns  All of the above recommendations were discussed with the patient, and all of patient's questions were answered to her expressed satisfaction.  -- Marilynne Drivers Rosana Hoes, MD, [place]: [facility] - Partnering for exceptional care. Office: [phone]

## 2016-11-07 ENCOUNTER — Ambulatory Visit
Admission: RE | Admit: 2016-11-07 | Discharge: 2016-11-07 | Disposition: A | Discharge status: Home / Self Care | Payer: 59 | Source: Ambulatory Visit | Attending: Surgery | Admitting: Surgery

## 2016-11-07 ENCOUNTER — Telehealth: Payer: Self-pay

## 2016-11-07 DIAGNOSIS — I82402 Acute embolism and thrombosis of unspecified deep veins of left lower extremity: Secondary | ICD-10-CM | POA: Insufficient documentation

## 2016-11-07 DIAGNOSIS — M25562 Pain in left knee: Secondary | ICD-10-CM

## 2016-11-07 MED ORDER — RIVAROXABAN (XARELTO) VTE STARTER PACK (15 & 20 MG): ORAL_TABLET | ORAL | 0 refills | Status: DC

## 2016-11-07 NOTE — Telephone Encounter (Signed)
Anna at [facility] is calling with the patients results. 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 The patient is on ultrasound waiting right now. I am going to route this message to amber right now.

## 2016-11-07 NOTE — Telephone Encounter (Signed)
Spoke with Dr. Rosana Hoes immediately after this call was taken. He would like to start the patient on Xeralto today 15mg  BID until day 22 and then 20mg  Daily afterwards. He also wants the patient to use compression stockings continuously and follow-up with PCP. Patient denies SOB/Dyspnea/Chest Pain at this time but was informed if she begins having any of these symptoms, she is to immediately proceed to ED or call 911. Patient verbalizes understanding.  Call was made to PCP, Dr. Charyl Bigger Office. No answer. Left detailed message with Yvette Jackson at answering service with patient's results, plan with Yvette Jackson, and that we are requesting urgent follow-up with Dr. Hall Busing for patient. She verbalizes understanding and will get message to Dr. Juanell Fairly nurse today after lunch hours.

## 2016-11-09 NOTE — Discharge Summary (Signed)
Physician Discharge Summary  Patient ID: CRIS GIBBY MRN: [id number] DOB/AGE: Mar 25, 1951 66 y.o.  Admit date: 10/21/2016 Discharge date: 11/09/2016  Admission Diagnoses:  Discharge Diagnoses:  Active Problems:   H/O hiatal hernia   Discharged Condition: good  Hospital Course: Patient is a 66 year old Female former smoker with COPD and some dyspnea on exertion who presented to surgical office for a large symptomatic hiatal hernia, for which workup including barium swallow demonstrated good peristalsis without dysplasia or masses. Pre-operative PFT's showed moderate obstructive airway disease responsive to bronchodilators. Informed consent was pre-operatively obtained, and patient presented for and underwent elective open repair of hiatal hernia with mesh and Nissen fundoplication following attempted laparoscopic repair of paraesophageal hiatal hernia with bleeding due to splenic capsular tear and liver retraction, both controlled upon conversion (Pabon, 7/3). Post-operatively, patient experienced in PACU transient hemoptysis and hypotension attributed to vagal response, after which her pain was well-controlled, her diet was gradually advanced, and her recovery was remarkable only for SOB and difficulty weaning O2 via nasal cannula, for which diuresis was initiated and pulmonary consultation was obtained, and acute blood loss anemia, for which patient received transfusion of PRBC. Ultimately, just as home O2 was being arranged, patient was able to tolerate weaning from supplemental O2 without desaturation or SOB, and discharge planning was initiated. Patient was then safely able to be discharged home with appropriate discharge instructions, follow-up, and pain medication.  Consults: Wallene Huh, MD (Pulmonary Medicine)  Significant Diagnostic Studies: None  Treatments: surgery: Open repair of hiatal hernia with mesh and Nissen fundoplication following attempted laparoscopic repair of  paraesophageal hiatal hernia  Discharge Exam: Blood pressure 123/62, pulse 89, temperature 98 F (36.7 C), temperature source Oral, resp. rate 19, height 5\' 3"  (1.6 m), weight 194 lb (88 kg), SpO2 96 %. General appearance: alert, cooperative and no distress GI: abdomen soft and non-distended with mild (appropriate) peri-incisional tenderness to palpation and incisions well-approximated and no erythema or drainage  Disposition: 01-Home or Self Care   Allergies as of 10/28/2016      Reactions   Acetaminophen Hives   Pt takes Advil at home for pain   Codeine Rash   Sulfa Antibiotics Swelling   Per pt takes Advil at home.    Sulfasalazine Swelling   Per pt takes Advil at home.       Medication List    TAKE these medications   albuterol 108 (90 Base) MCG/ACT inhaler Commonly known as:  PROVENTIL HFA;VENTOLIN HFA Inhale 2 puffs into the lungs every 6 (six) hours as needed for wheezing or shortness of breath.   aspirin EC 81 MG tablet Take 81 mg by mouth daily.   escitalopram 20 MG tablet Commonly known as:  LEXAPRO Take 20 mg by mouth daily.   fexofenadine 180 MG tablet Commonly known as:  ALLEGRA Take 180 mg by mouth daily as needed for allergies or rhinitis.   meclizine 25 MG tablet Commonly known as:  ANTIVERT TAKE 1 TABLET BY MOUTH EVERY 8 HOURS AS NEEDED FOR DIZZINESS   omeprazole 20 MG tablet Commonly known as:  PRILOSEC OTC Take 1 tablet (20 mg total) by mouth daily.   oxyCODONE 5 MG/5ML solution Commonly known as:  ROXICODONE Take 5 mLs (5 mg total) by mouth every 4 (four) hours as needed for severe pain.   SYMBICORT 160-4.5 MCG/ACT inhaler Generic drug:  budesonide-formoterol Inhale 2 puffs into the lungs daily.      Follow-up Information    Vickie Epley,  MD. Daphane Shepherd on 11/05/2016.   Specialty:  General Surgery Why:  @1 :30pm. PLEASE ARRIVE @1 :15PM Contact information: [place] [address] [phone]            Signed: Vickie Epley 11/09/2016, 1:56 PM

## 2016-11-10 ENCOUNTER — Telehealth: Payer: Self-pay

## 2016-11-10 MED ORDER — TRAMADOL HCL 50 MG PO TABS: 50.0000 mg | ORAL_TABLET | Freq: Four times a day (QID) | ORAL | 0 refills | Status: DC | PRN

## 2016-11-10 NOTE — Telephone Encounter (Signed)
Patient is requesting pain medication. Please call patient and advice.

## 2016-11-10 NOTE — Telephone Encounter (Signed)
Spoke with patient. She is requesting a refill on pain medicine. I spoke with Dr.Pabon and he said the patient could have Tramadol. Patient is sending her brother daniel

## 2016-11-10 NOTE — Telephone Encounter (Signed)
Patient is sending her brother Kandace Blitz to pick up prescription.

## 2016-11-10 NOTE — Addendum Note (Signed)
Addended by: Celene Kras on: 11/10/2016 02:33 PM   Modules accepted: Orders

## 2016-11-12 ENCOUNTER — Encounter: Payer: 59 | Admitting: Surgery

## 2016-11-13 ENCOUNTER — Telehealth: Payer: Self-pay

## 2016-11-13 ENCOUNTER — Encounter: Payer: Self-pay | Admitting: Surgery

## 2016-11-13 ENCOUNTER — Ambulatory Visit (INDEPENDENT_AMBULATORY_CARE_PROVIDER_SITE_OTHER): Payer: 59 | Admitting: Surgery

## 2016-11-13 VITALS — BP 130/85 | HR 94 | Temp 98.3°F | Ht 63.0 in | Wt 183.0 lb

## 2016-11-13 DIAGNOSIS — Z09 Encounter for follow-up examination after completed treatment for conditions other than malignant neoplasm: Secondary | ICD-10-CM

## 2016-11-13 MED ORDER — GABAPENTIN (ONCE-DAILY) 300 MG PO TABS: 3.0000 | ORAL_TABLET | Freq: Three times a day (TID) | ORAL | 0 refills | Status: DC

## 2016-11-13 NOTE — Patient Instructions (Signed)
Please pick up your Gabapentin at the pharmacy.  Please follow-up as scheduled below.  If you have any questions or concerns prior to your next appointment, please call our office.

## 2016-11-13 NOTE — Telephone Encounter (Signed)
Spoke with patient at this time. She states that she has not had any SOB or Chest Pain. She has picked up and started her Xarelto. She has yet to hear from Dr. Juanell Fairly office for follow-up regarding this clot.  I spoke with Dr. Juanell Fairly office staff, Ivin Booty at this time. All results given with complete scenario of how patient presented and medication she was placed on with dosage and instructions. She has placed patient on schedule to see Dr. Hall Busing on Monday, 11/17/16 at 3pm to follow-up on this DVT. She has requested that notes from today's visit and [place] results be faxed to their office when completed.  Fax Number [phone] Attn: Ivin Booty.

## 2016-11-13 NOTE — Progress Notes (Signed)
S/p hiatal hernia Doing well No swallowing issues Some pain , tramadol prescribed and actually made her feel worst + BM + DVT on xarelto   PE NAD Abd: incisions c/d/i, staples removed, no infection or peritonitis  A/p Doing well Total 6 weeks of soft diet  RTC 2-3 weeks no surgical issues May use gabapentin adjuvant pain med

## 2016-11-14 ENCOUNTER — Telehealth: Payer: Self-pay

## 2016-11-14 MED ORDER — GABAPENTIN 300 MG PO CAPS: 300.0000 mg | ORAL_CAPSULE | Freq: Three times a day (TID) | ORAL | 0 refills | Status: DC

## 2016-11-14 NOTE — Telephone Encounter (Signed)
Patient was prescribed Gabapentin by Dr. Dahlia Byes but her insurance is not covering it. Is there anything else we can prescribe her? Please call patient and advice.

## 2016-11-14 NOTE — Telephone Encounter (Signed)
Call will be made to Pharmacy to see what other options are available to patient with her current insurance. Will return phone call to patient after speaking with Pharmacist.

## 2016-11-14 NOTE — Telephone Encounter (Signed)
Faxed patient's U/[place] and Office Visits per Dr. Corlis Leak request to send to patient's Primary Care Doctor (Dr. Hall Busing).

## 2016-11-14 NOTE — Telephone Encounter (Signed)
Spoke with pharmacist at [facility] in [place] at this time. He states that order was placed as tablets and this was the problem with the prescription. Old order D/C'd and new order placed for capsules. He states that insurance will cover without a problem and patient should call in 30 minutes to 1 hour to make sure it is ready for pick up.  Call made to patient. She was informed of this information at this time.

## 2016-11-22 ENCOUNTER — Encounter: Payer: Self-pay | Admitting: Emergency Medicine

## 2016-11-22 ENCOUNTER — Emergency Department
Admission: EM | Admit: 2016-11-22 | Discharge: 2016-11-22 | Disposition: A | Discharge status: Home / Self Care | Payer: 59 | Attending: Emergency Medicine | Admitting: Emergency Medicine

## 2016-11-22 DIAGNOSIS — Z79899 Other long term (current) drug therapy: Secondary | ICD-10-CM | POA: Diagnosis not present

## 2016-11-22 DIAGNOSIS — Z87891 Personal history of nicotine dependence: Secondary | ICD-10-CM | POA: Insufficient documentation

## 2016-11-22 DIAGNOSIS — Z7982 Long term (current) use of aspirin: Secondary | ICD-10-CM | POA: Diagnosis not present

## 2016-11-22 DIAGNOSIS — Y829 Unspecified medical devices associated with adverse incidents: Secondary | ICD-10-CM | POA: Insufficient documentation

## 2016-11-22 DIAGNOSIS — Z4800 Encounter for change or removal of nonsurgical wound dressing: Secondary | ICD-10-CM | POA: Diagnosis present

## 2016-11-22 DIAGNOSIS — Z5189 Encounter for other specified aftercare: Secondary | ICD-10-CM

## 2016-11-22 DIAGNOSIS — T8189XA Other complications of procedures, not elsewhere classified, initial encounter: Secondary | ICD-10-CM | POA: Insufficient documentation

## 2016-11-22 NOTE — Discharge Instructions (Signed)
Follow-up with your  primary care doctor or Dr. Dahlia Byes if any continued problems. Left Steri-Strip fall off on its own.

## 2016-11-22 NOTE — ED Triage Notes (Signed)
Pt to ed with c/o drainage from surgical site from hernia surgery 1 month ago.  Pt reports steri strip was pulled off yesterday and then she noticed serosanguinous drainage. Pt denies pain, no swelling and no redness noted at site.

## 2016-11-22 NOTE — ED Provider Notes (Signed)
[facility] Emergency Department Provider Note  ____________________________________________   None    (approximate)  I have reviewed the triage vital signs and the nursing notes.   HISTORY  Chief Complaint Post-op Problem    HPI ASHTON SABINE is a 66 y.o. female is here with complaint of drainage from surgical site. Patient states that she had hernia surgery one month ago. She denies any previous problems and is not aware of any fever or chills. She states that the Steri-Strip pulled off yesterday and she noticed some drainage. She denies any pain or swelling. Patient continues to take her medication as prescribed.   Past Medical History:  Diagnosis Date  . Anxiety   . Arthritis   . Bronchitis   . Difficult intubation   . Dyspnea    with exertion  . Family history of adverse reaction to anesthesia    Sister "died from anesthesia" many years ago during thyroid surgery  . History of hiatal hernia   . Motion sickness    cars  . Panic attacks     Patient Active Problem List   Diagnosis Date Noted  . H/O hiatal hernia 10/21/2016  . Anxiety 10/14/2016  . Abdominal pain, epigastric   . Hiatal hernia   . Bronchitis 11/02/2013    Past Surgical History:  Procedure Laterality Date  . [place]  . ESOPHAGOGASTRODUODENOSCOPY (EGD) WITH PROPOFOL N/A 09/18/2016   Procedure: ESOPHAGOGASTRODUODENOSCOPY (EGD) WITH PROPOFOL;  Surgeon: Lucilla Lame, MD;  Location: [facility];  Service: Endoscopy;  Laterality: N/A;  . HIATAL HERNIA REPAIR N/A 10/21/2016   Procedure: LAPAROSCOPIC REPAIR OF HIATAL HERNIA;  Surgeon: Jules Husbands, MD;  Location: [facility];  Service: General;  Laterality: N/A;  . HIATAL HERNIA REPAIR N/A 10/21/2016   Procedure: HERNIA REPAIR HIATAL;  Surgeon: Jules Husbands, MD;  Location: [facility];  Service: General;  Laterality: N/A;  . TONSILLECTOMY      Prior to Admission medications   Medication Sig Start Date End  Date Taking? Authorizing Provider  albuterol (PROVENTIL HFA;VENTOLIN HFA) 108 (90 Base) MCG/ACT inhaler Inhale 2 puffs into the lungs every 6 (six) hours as needed for wheezing or shortness of breath.     [provider]  aspirin EC 81 MG tablet Take 81 mg by mouth daily.    [provider]  budesonide-formoterol (SYMBICORT) 160-4.5 MCG/ACT inhaler Inhale 2 puffs into the lungs daily.     [provider]  escitalopram (LEXAPRO) 20 MG tablet Take 20 mg by mouth daily.  08/18/16   [provider]  fexofenadine (ALLEGRA) 180 MG tablet Take 180 mg by mouth daily as needed for allergies or rhinitis.    [provider]  gabapentin (NEURONTIN) 300 MG capsule Take 1 capsule (300 mg total) by mouth 3 (three) times daily. 11/14/16   Pabon, Diego F, MD  meclizine (ANTIVERT) 25 MG tablet TAKE 1 TABLET BY MOUTH EVERY 8 HOURS AS NEEDED FOR DIZZINESS 07/16/16   [provider]  omeprazole (PRILOSEC OTC) 20 MG tablet Take 1 tablet (20 mg total) by mouth daily. 10/28/16   Vickie Epley, MD  oxyCODONE (ROXICODONE) 5 MG/5ML solution Take 5 mLs (5 mg total) by mouth every 4 (four) hours as needed for severe pain. 10/28/16   Vickie Epley, MD  Rivaroxaban 15 & 20 MG TBPK Take as directed on package: Start with one 15mg  tablet by mouth twice a day with food. On Day 22, switch to  one 20mg  tablet once a day with food. 11/07/16   Vickie Epley, MD  traMADol (ULTRAM) 50 MG tablet Take 1 tablet (50 mg total) by mouth every 6 (six) hours as needed. 11/10/16   Pabon, Marjory Lies, MD    Allergies Acetaminophen; Codeine; Sulfa antibiotics; and Sulfasalazine  Family History  Problem Relation Age of Onset  . Diabetes Mother   . Heart disease Mother   . COPD Father     Social History Social History  Substance Use Topics  . Smoking status: Former Smoker    Packs/day: 1.00    Years: 40.00    Types: Cigarettes    Quit date: 05/06/2016  . Smokeless tobacco: Never Used    . Alcohol use No    Review of Systems Constitutional: No fever/chills Cardiovascular: Denies chest pain. Respiratory: Denies shortness of breath. Gastrointestinal: No abdominal pain.  No nausea, no vomiting.  Skin: Healing surgical wound. Neurological: Negative for headaches, focal weakness or numbness.   ____________________________________________   PHYSICAL EXAM:  VITAL SIGNS: ED Triage Vitals [11/22/16 1023]  Enc Vitals Group     BP 140/75     Pulse Rate 86     Resp 20     Temp 98.5 F (36.9 C)     Temp Source Oral     SpO2 97 %     Weight 183 lb (83 kg)     Height      Head Circumference      Peak Flow      Pain Score      Pain Loc      Pain Edu?      Excl. in [place]?    Constitutional: Alert and oriented. Well appearing and in no acute distress. Eyes: Conjunctivae are normal.  Head: Atraumatic. Nose: No congestion/rhinnorhea. Neck: No stridor.   Cardiovascular: Normal rate, regular rhythm. Grossly normal heart sounds.  Good peripheral circulation. Respiratory: Normal respiratory effort.  No retractions. Lungs CTAB. Gastrointestinal: Soft and nontender. No distention. Bowel sounds normoactive 4 quadrants. Neurologic:  Normal speech and language. No gross focal neurologic deficits are appreciated. No gait instability. Skin:  Skin is warm, dry. Eye examination of the surgical scar of the abdomen there is no active drainage or erythema. Area is nontender to palpation. There is a 1 cm superficial dehiscence area.  Remainder of the surgical site continues to have Steri-Strips on it. Psychiatric: Mood and affect are normal. Speech and behavior are normal.  ____________________________________________   LABS (all labs ordered are listed, but only abnormal results are displayed)  Labs Reviewed - No data to display   PROCEDURES  Procedure(s) performed: None  Procedures  Critical Care performed: No  ____________________________________________   INITIAL  IMPRESSION / ASSESSMENT AND PLAN / ED COURSE  Pertinent labs & imaging results that were available during my care of the patient were reviewed by me and considered in my medical decision making (see chart for details).  Steri-Strip was applied to the area since patient seems to be anxious about the area being exposed. She is to follow-up with her PCP or Dr. Dahlia Byes if any continued problems.  She was reassured that the area is healing as expected and that there are no signs of infection. Patient currently is taking rivaroxaban for DVT as seen in her medical records.  She denies any continued problems.         ____________________________________________   FINAL CLINICAL IMPRESSION(S) / ED DIAGNOSES  Final diagnoses:  Visit for wound check  NEW MEDICATIONS STARTED DURING THIS VISIT:  Discharge Medication List as of 11/22/2016 10:58 AM       Note:  This document was prepared using Dragon voice recognition software and may include unintentional dictation errors.    Johnn Hai, PA-C 11/22/16 1110    Delman Kitten, MD 11/22/16 [phone]

## 2016-11-24 ENCOUNTER — Telehealth: Payer: Self-pay

## 2016-11-24 NOTE — Telephone Encounter (Signed)
Friday evening one of her steri stripes feel off and pulled the scab and started bleeding. Patient went to the ED and they replace the steri stripe and stated in the note. Patient says that it has been oozing yellowish drain. She placed some gauze over the wound to try and collect some of the drainage. Wants to know how long it should drain it and if it is normal? I informed her that the fluid that she has draining was normal and that it is her body's normal reaction to make a scab. I also told her that it would take about two weeks for that fluid to start making a scab and that if she doesn't see any sign of a scab forming then to give [place] a call and we will see her in office. Patient verbalized understanding.

## 2016-11-28 ENCOUNTER — Telehealth: Payer: Self-pay

## 2016-11-28 NOTE — Telephone Encounter (Signed)
Patient's disability paperwork has been placed in folder up front.

## 2016-11-28 NOTE — Telephone Encounter (Signed)
Can you route this to the BSA box...stating that the patient has disability forms in box.

## 2016-12-01 ENCOUNTER — Other Ambulatory Visit: Payer: Self-pay

## 2016-12-02 ENCOUNTER — Telehealth: Payer: Self-pay

## 2016-12-02 NOTE — Telephone Encounter (Signed)
Patient's disability form was filled out with new corrections and faxed. As of now, patient is to return to work on 12/08/2016. However, when she comes in on 12/05/2016 ans sees Dr. Dahlia Byes, then we would correct dates and refax if told by the physician.

## 2016-12-05 ENCOUNTER — Ambulatory Visit (INDEPENDENT_AMBULATORY_CARE_PROVIDER_SITE_OTHER): Payer: 59 | Admitting: Surgery

## 2016-12-05 ENCOUNTER — Encounter: Payer: Self-pay | Admitting: Surgery

## 2016-12-05 VITALS — BP 146/89 | HR 90 | Temp 98.8°F | Ht 63.0 in | Wt 180.0 lb

## 2016-12-05 DIAGNOSIS — Z09 Encounter for follow-up examination after completed treatment for conditions other than malignant neoplasm: Secondary | ICD-10-CM

## 2016-12-05 NOTE — Progress Notes (Signed)
Yvette Jackson is post open had a hernia repair with fundoplication and mesh placement on July 3 Overall she's been doing well and she is tolerating now a regular food. Reports some decrease in appetite. As reports some constipation and some bright red blood per rectum. Now has some scant drainage from the abdominal wound.  PE NAD Abd: soft, there is an area measuring 3 mm diameter w some granulation tissue, silver nitrate placed. No infection, no peritonitis. I do not feel any sutures.  A/P Doing well Does with the patient about importance of high-fiber and MiraLAX to regular her bowels. Daily Band-Aid to the applied to tiny abdominal wound. These might be the reaction to the knot from the PDS. No evidence of surgical complications this time. We will refer to GI for colonoscopy given her hematochezia. RTC 2 -3 weeks

## 2016-12-05 NOTE — Patient Instructions (Addendum)
We will follow up with you as listed below:  Also listed below are some high fiber diet suggestions as well as the Miralax that was discussed today for you to start taking daily as well. Eating more frequently will help with you having normal bowel movements as well.  I have also sent over a referral to [facility]. They will contact you with an appointment. If you do not hear from them within the next few weeks please give our office a call.   High-Fiber Diet Fiber, also called dietary fiber, is a type of carbohydrate found in fruits, vegetables, whole grains, and beans. A high-fiber diet can have many health benefits. Your health care provider may recommend a high-fiber diet to help:  Prevent constipation. Fiber can make your bowel movements more regular.  Lower your cholesterol.  Relieve hemorrhoids, uncomplicated diverticulosis, or irritable bowel syndrome.  Prevent overeating as part of a weight-loss plan.  Prevent heart disease, type 2 diabetes, and certain cancers.  What is my plan? The recommended daily intake of fiber includes:  38 grams for men under age 15.  62 grams for men over age 67.  24 grams for women under age 42.  95 grams for women over age 1.  You can get the recommended daily intake of dietary fiber by eating a variety of fruits, vegetables, grains, and beans. Your health care provider may also recommend a fiber supplement if it is not possible to get enough fiber through your diet. What do I need to know about a high-fiber diet?  Fiber supplements have not been widely studied for their effectiveness, so it is better to get fiber through food sources.  Always check the fiber content on thenutrition facts label of any prepackaged food. Look for foods that contain at least 5 grams of fiber per serving.  Ask your dietitian if you have questions about specific foods that are related to your condition, especially if those foods are not listed in the following  section.  Increase your daily fiber consumption gradually. Increasing your intake of dietary fiber too quickly may cause bloating, cramping, or gas.  Drink plenty of water. Water helps you to digest fiber. What foods can I eat? Grains Whole-grain breads. Multigrain cereal. Oats and oatmeal. Brown rice. Barley. Bulgur wheat. [place]. Bran muffins. Popcorn. Rye wafer crackers. Vegetables Sweet potatoes. Spinach. Kale. Artichokes. Cabbage. Broccoli. Green peas. Carrots. Squash. Fruits Berries. Pears. Apples. Oranges. Avocados. Prunes and raisins. Dried figs. Meats and Other Protein Sources [place], kidney, pinto, and soy beans. Split peas. Lentils. Nuts and seeds. Dairy Fiber-fortified yogurt. Beverages Fiber-fortified soy milk. Fiber-fortified orange juice. Other Fiber bars. The items listed above may not be a complete list of recommended foods or beverages. Contact your dietitian for more options. What foods are not recommended? Grains White bread. Pasta made with refined flour. White rice. Vegetables Fried potatoes. Canned vegetables. Well-cooked vegetables. Fruits Fruit juice. Cooked, strained fruit. Meats and Other Protein Sources Fatty cuts of meat. Fried Sales executive or fried fish. Dairy Milk. Yogurt. Cream cheese. Sour cream. Beverages Soft drinks. Other Cakes and pastries. Butter and oils. The items listed above may not be a complete list of foods and beverages to avoid. Contact your dietitian for more information. What are some tips for including high-fiber foods in my diet?  Eat a wide variety of high-fiber foods.  Make sure that half of all grains consumed each day are whole grains.  Replace breads and cereals made from refined flour or white flour  with whole-grain breads and cereals.  Replace white rice with brown rice, bulgur wheat, or millet.  Start the day with a breakfast that is high in fiber, such as a cereal that contains at least 5 grams of fiber per  serving.  Use beans in place of meat in soups, salads, or pasta.  Eat high-fiber snacks, such as berries, raw vegetables, nuts, or popcorn. This information is not intended to replace advice given to you by your health care provider. Make sure you discuss any questions you have with your health care provider. Document Released: 04/07/2005 Document Revised: 09/13/2015 Document Reviewed: 09/20/2013 Elsevier Interactive Patient Education  2017 Elsevier Inc.     Polyethylene Glycol powder What is this medicine? POLYETHYLENE GLYCOL 3350 (pol ee ETH i leen; GLYE col) powder is a laxative used to treat constipation. It increases the amount of water in the stool. Bowel movements become easier and more frequent. This medicine may be used for other purposes; ask your health care provider or pharmacist if you have questions. COMMON BRAND NAME(S): Sharlyn Bologna, GlycoLax, MiraLax, Smooth LAX, [facility] What should I tell my health care provider before I take this medicine? They need to know if you have any of these conditions: -a history of blockage of the stomach or intestine -current abdomen distension or pain -difficulty swallowing -diverticulitis, ulcerative colitis, or other chronic bowel disease -phenylketonuria -an unusual or allergic reaction to polyethylene glycol, other medicines, dyes, or preservatives -pregnant or trying to get pregnant -breast-feeding How should I use this medicine? Take this medicine by mouth. The bottle has a measuring cap that is marked with a line. Pour the powder into the cap up to the marked line (the dose is about 1 heaping tablespoon). Add the powder in the cap to a full glass (4 to 8 ounces or 120 to 240 ml) of water, juice, soda, coffee or tea. Mix the powder well. Drink the solution. Take exactly as directed. Do not take your medicine more often than directed. Talk to your pediatrician regarding the use of this medicine in children. Special care may be  needed. Overdosage: If you think you have taken too much of this medicine contact a poison control center or emergency room at once. NOTE: This medicine is only for you. Do not share this medicine with others. What if I miss a dose? If you miss a dose, take it as soon as you can. If it is almost time for your next dose, take only that dose. Do not take double or extra doses. What may interact with this medicine? Interactions are not expected. This list may not describe all possible interactions. Give your health care provider a list of all the medicines, herbs, non-prescription drugs, or dietary supplements you use. Also tell them if you smoke, drink alcohol, or use illegal drugs. Some items may interact with your medicine. What should I watch for while using this medicine? Do not use for more than 2 weeks without advice from your doctor or health care professional. It can take 2 to 4 days to have a bowel movement and to experience improvement in constipation. See your health care professional for any changes in bowel habits, including constipation, that are severe or last longer than three weeks. Always take this medicine with plenty of water. What side effects may I notice from receiving this medicine? Side effects that you should report to your doctor or health care professional as soon as possible: -diarrhea -difficulty breathing -itching of the skin,  hives, or skin rash -severe bloating, pain, or distension of the stomach -vomiting Side effects that usually do not require medical attention (report to your doctor or health care professional if they continue or are bothersome): -bloating or gas -lower abdominal discomfort or cramps -nausea This list may not describe all possible side effects. Call your doctor for medical advice about side effects. You may report side effects to FDA at 1-800-FDA-1088. Where should I keep my medicine? Keep out of the reach of children. Store between 15 and  30 degrees C (59 and 86 degrees F). Throw away any unused medicine after the expiration date. NOTE: This sheet is a summary. It may not cover all possible information. If you have questions about this medicine, talk to your doctor, pharmacist, or health care provider.  2018 Elsevier/Gold Standard (2007-11-08 16:50:45)

## 2016-12-10 ENCOUNTER — Telehealth: Payer: Self-pay

## 2016-12-10 NOTE — Telephone Encounter (Signed)
Patient's disability paperwork was updated with return to work date of 12/08/16 without restrictions per Dr. Caroleen Hamman. Faxed to [employer] [phone] with positive confirmation. Fax Job # [phone]

## 2016-12-17 ENCOUNTER — Other Ambulatory Visit: Payer: Self-pay | Admitting: Surgery

## 2016-12-18 ENCOUNTER — Encounter: Payer: Self-pay | Admitting: Surgery

## 2016-12-18 ENCOUNTER — Ambulatory Visit (INDEPENDENT_AMBULATORY_CARE_PROVIDER_SITE_OTHER): Payer: 59 | Admitting: Surgery

## 2016-12-18 VITALS — BP 134/83 | HR 91 | Temp 98.3°F | Wt 179.0 lb

## 2016-12-18 DIAGNOSIS — Z09 Encounter for follow-up examination after completed treatment for conditions other than malignant neoplasm: Secondary | ICD-10-CM

## 2016-12-18 NOTE — Patient Instructions (Signed)

## 2016-12-18 NOTE — Progress Notes (Signed)
S/p open HH repair w mesh Doing very well Only complaint is mild hoarseness Taking Po, + BM No dysphagia Significant improvement of resp sxs after repair  PE NAd Abd: soft, incisions healing well, 2 mm upper portion of granulation tissue. No infection. Bandaid applied  A/p Doing well No surgical issues F/U prn

## 2017-01-26 ENCOUNTER — Other Ambulatory Visit: Payer: Self-pay | Admitting: Surgery

## 2017-02-18 ENCOUNTER — Other Ambulatory Visit: Payer: Self-pay

## 2017-02-18 DIAGNOSIS — K449 Diaphragmatic hernia without obstruction or gangrene: Secondary | ICD-10-CM

## 2017-02-18 MED ORDER — OMEPRAZOLE MAGNESIUM 20 MG PO TBEC: 20.0000 mg | DELAYED_RELEASE_TABLET | Freq: Every day | ORAL | 3 refills | Status: DC

## 2017-05-27 ENCOUNTER — Encounter: Payer: Self-pay | Admitting: Obstetrics and Gynecology

## 2017-05-27 ENCOUNTER — Ambulatory Visit (INDEPENDENT_AMBULATORY_CARE_PROVIDER_SITE_OTHER): Payer: BLUE CROSS/BLUE SHIELD | Admitting: Obstetrics and Gynecology

## 2017-05-27 VITALS — BP 133/74 | HR 103 | Ht 63.0 in | Wt 197.7 lb

## 2017-05-27 DIAGNOSIS — E669 Obesity, unspecified: Secondary | ICD-10-CM | POA: Diagnosis not present

## 2017-05-27 DIAGNOSIS — Z780 Asymptomatic menopausal state: Secondary | ICD-10-CM | POA: Diagnosis not present

## 2017-05-27 DIAGNOSIS — Z1231 Encounter for screening mammogram for malignant neoplasm of breast: Secondary | ICD-10-CM

## 2017-05-27 DIAGNOSIS — Z1239 Encounter for other screening for malignant neoplasm of breast: Secondary | ICD-10-CM

## 2017-05-27 DIAGNOSIS — Z01419 Encounter for gynecological examination (general) (routine) without abnormal findings: Secondary | ICD-10-CM | POA: Diagnosis not present

## 2017-05-27 NOTE — Patient Instructions (Signed)
1.  Pap smear is done 2.  Mammogram is ordered 3.  Screening labs are to be obtained through primary care 4.  Colonoscopy is being scheduled through primary care 5.  Continue with healthy eating and exercise 6.  Recommend calcium 1200 mg a day and vitamin D 800 international units daily for osteoporosis prevention 7.  Return in 1 year for annual exam   Health Maintenance for Postmenopausal Women Menopause is a normal process in which your reproductive ability comes to an end. This process happens gradually over a span of months to years, usually between the ages of 57 and 79. Menopause is complete when you have missed 12 consecutive menstrual periods. It is important to talk with your health care provider about some of the most common conditions that affect postmenopausal women, such as heart disease, cancer, and bone loss (osteoporosis). Adopting a healthy lifestyle and getting preventive care can help to promote your health and wellness. Those actions can also lower your chances of developing some of these common conditions. What should I know about menopause? During menopause, you may experience a number of symptoms, such as:  Moderate-to-severe hot flashes.  Night sweats.  Decrease in sex drive.  Mood swings.  Headaches.  Tiredness.  Irritability.  Memory problems.  Insomnia.  Choosing to treat or not to treat menopausal changes is an individual decision that you make with your health care provider. What should I know about hormone replacement therapy and supplements? Hormone therapy products are effective for treating symptoms that are associated with menopause, such as hot flashes and night sweats. Hormone replacement carries certain risks, especially as you become older. If you are thinking about using estrogen or estrogen with progestin treatments, discuss the benefits and risks with your health care provider. What should I know about heart disease and stroke? Heart  disease, heart attack, and stroke become more likely as you age. This may be due, in part, to the hormonal changes that your body experiences during menopause. These can affect how your body processes dietary fats, triglycerides, and cholesterol. Heart attack and stroke are both medical emergencies. There are many things that you can do to help prevent heart disease and stroke:  Have your blood pressure checked at least every 1-2 years. High blood pressure causes heart disease and increases the risk of stroke.  If you are 92-91 years old, ask your health care provider if you should take aspirin to prevent a heart attack or a stroke.  Do not use any tobacco products, including cigarettes, chewing tobacco, or electronic cigarettes. If you need help quitting, ask your health care provider.  It is important to eat a healthy diet and maintain a healthy weight. ? Be sure to include plenty of vegetables, fruits, low-fat dairy products, and lean protein. ? Avoid eating foods that are high in solid fats, added sugars, or salt (sodium).  Get regular exercise. This is one of the most important things that you can do for your health. ? Try to exercise for at least 150 minutes each week. The type of exercise that you do should increase your heart rate and make you sweat. This is known as moderate-intensity exercise. ? Try to do strengthening exercises at least twice each week. Do these in addition to the moderate-intensity exercise.  Know your numbers.Ask your health care provider to check your cholesterol and your blood glucose. Continue to have your blood tested as directed by your health care provider.  What should I know about cancer  screening? There are several types of cancer. Take the following steps to reduce your risk and to catch any cancer development as early as possible. Breast Cancer  Practice breast self-awareness. ? This means understanding how your breasts normally appear and feel. ? It  also means doing regular breast self-exams. Let your health care provider know about any changes, no matter how small.  If you are 27 or older, have a clinician do a breast exam (clinical breast exam or CBE) every year. Depending on your age, family history, and medical history, it may be recommended that you also have a yearly breast X-ray (mammogram).  If you have a family history of breast cancer, talk with your health care provider about genetic screening.  If you are at high risk for breast cancer, talk with your health care provider about having an MRI and a mammogram every year.  Breast cancer (BRCA) gene test is recommended for women who have family members with BRCA-related cancers. Results of the assessment will determine the need for genetic counseling and BRCA1 and for BRCA2 testing. BRCA-related cancers include these types: ? Breast. This occurs in males or females. ? Ovarian. ? Tubal. This may also be called fallopian tube cancer. ? Cancer of the abdominal or pelvic lining (peritoneal cancer). ? Prostate. ? Pancreatic.  Cervical, Uterine, and Ovarian Cancer Your health care provider may recommend that you be screened regularly for cancer of the pelvic organs. These include your ovaries, uterus, and vagina. This screening involves a pelvic exam, which includes checking for microscopic changes to the surface of your cervix (Pap test).  For women ages 21-65, health care providers may recommend a pelvic exam and a Pap test every three years. For women ages 39-65, they may recommend the Pap test and pelvic exam, combined with testing for human papilloma virus (HPV), every five years. Some types of HPV increase your risk of cervical cancer. Testing for HPV may also be done on women of any age who have unclear Pap test results.  Other health care providers may not recommend any screening for nonpregnant women who are considered low risk for pelvic cancer and have no symptoms. Ask your  health care provider if a screening pelvic exam is right for you.  If you have had past treatment for cervical cancer or a condition that could lead to cancer, you need Pap tests and screening for cancer for at least 20 years after your treatment. If Pap tests have been discontinued for you, your risk factors (such as having a new sexual partner) need to be reassessed to determine if you should start having screenings again. Some women have medical problems that increase the chance of getting cervical cancer. In these cases, your health care provider may recommend that you have screening and Pap tests more often.  If you have a family history of uterine cancer or ovarian cancer, talk with your health care provider about genetic screening.  If you have vaginal bleeding after reaching menopause, tell your health care provider.  There are currently no reliable tests available to screen for ovarian cancer.  Lung Cancer Lung cancer screening is recommended for adults 51-51 years old who are at high risk for lung cancer because of a history of smoking. A yearly low-dose CT scan of the lungs is recommended if you:  Currently smoke.  Have a history of at least 30 pack-years of smoking and you currently smoke or have quit within the past 15 years. A pack-year is smoking  an average of one pack of cigarettes per day for one year.  Yearly screening should:  Continue until it has been 15 years since you quit.  Stop if you develop a health problem that would prevent you from having lung cancer treatment.  Colorectal Cancer  This type of cancer can be detected and can often be prevented.  Routine colorectal cancer screening usually begins at age 50 and continues through age 19.  If you have risk factors for colon cancer, your health care provider may recommend that you be screened at an earlier age.  If you have a family history of colorectal cancer, talk with your health care provider about genetic  screening.  Your health care provider may also recommend using home test kits to check for hidden blood in your stool.  A small camera at the end of a tube can be used to examine your colon directly (sigmoidoscopy or colonoscopy). This is done to check for the earliest forms of colorectal cancer.  Direct examination of the colon should be repeated every 5-10 years until age 20. However, if early forms of precancerous polyps or small growths are found or if you have a family history or genetic risk for colorectal cancer, you may need to be screened more often.  Skin Cancer  Check your skin from head to toe regularly.  Monitor any moles. Be sure to tell your health care provider: ? About any new moles or changes in moles, especially if there is a change in a mole's shape or color. ? If you have a mole that is larger than the size of a pencil eraser.  If any of your family members has a history of skin cancer, especially at a young age, talk with your health care provider about genetic screening.  Always use sunscreen. Apply sunscreen liberally and repeatedly throughout the day.  Whenever you are outside, protect yourself by wearing long sleeves, pants, a wide-brimmed hat, and sunglasses.  What should I know about osteoporosis? Osteoporosis is a condition in which bone destruction happens more quickly than new bone creation. After menopause, you may be at an increased risk for osteoporosis. To help prevent osteoporosis or the bone fractures that can happen because of osteoporosis, the following is recommended:  If you are 37-3 years old, get at least 1,000 mg of calcium and at least 600 mg of vitamin D per day.  If you are older than age 68 but younger than age 45, get at least 1,200 mg of calcium and at least 600 mg of vitamin D per day.  If you are older than age 34, get at least 1,200 mg of calcium and at least 800 mg of vitamin D per day.  Smoking and excessive alcohol intake  increase the risk of osteoporosis. Eat foods that are rich in calcium and vitamin D, and do weight-bearing exercises several times each week as directed by your health care provider. What should I know about how menopause affects my mental health? Depression may occur at any age, but it is more common as you become older. Common symptoms of depression include:  Low or sad mood.  Changes in sleep patterns.  Changes in appetite or eating patterns.  Feeling an overall lack of motivation or enjoyment of activities that you previously enjoyed.  Frequent crying spells.  Talk with your health care provider if you think that you are experiencing depression. What should I know about immunizations? It is important that you get and maintain your  immunizations. These include:  Tetanus, diphtheria, and pertussis (Tdap) booster vaccine.  Influenza every year before the flu season begins.  Pneumonia vaccine.  Shingles vaccine.  Your health care provider may also recommend other immunizations. This information is not intended to replace advice given to you by your health care provider. Make sure you discuss any questions you have with your health care provider. Document Released: 05/30/2005 Document Revised: 10/26/2015 Document Reviewed: 01/09/2015 Elsevier Interactive Patient Education  2018 [employer].

## 2017-05-27 NOTE — Progress Notes (Signed)
ANNUAL PREVENTATIVE CARE GYN  ENCOUNTER NOTE  Subjective:       Yvette Jackson is a 67 y.o. G73P1001 female here for a routine annual gynecologic exam.  Current complaints: 1.  None  Primary CARE physician: Dr. Benita Stabile Patient is undergoing workup by Dr. Hall Busing for chronic anemia; concern is for possible undiagnosed blood loss source. Patient reports regular bowel function.  She has not noted any blood in the stool or melena.  She does have colonoscopy procedure in the works. Patient does report normal bladder function. She does deny history of vaginal bleeding or pelvic pain.  No vaginitis symptoms.   Gynecologic History No LMP recorded. Patient is postmenopausal. Contraception: post menopausal status Last Pap: 03/2016- wnl. Results were: normal Last mammogram: 03/2016 birad1 . Results were: normal Menarche-age 70 Menopause-age 71 No history of HRT therapy No history of abnormal Pap smears. No history of gynecologic surgery other than cesarean section  Obstetric History OB History  Gravida Para Term Preterm AB Living  1 1 1     1   SAB TAB Ectopic Multiple Live Births          1    # Outcome Date GA Lbr Len/2nd Weight Sex Delivery Anes PTL Lv  1 Term 1982   7 lb 9.6 oz (3.447 kg) M CS-LTranv   LIV      Past Medical History:  Diagnosis Date  . Anxiety   . Arthritis   . Bronchitis   . Difficult intubation   . Dyspnea    with exertion  . Family history of adverse reaction to anesthesia    Sister "died from anesthesia" many years ago during thyroid surgery  . History of hiatal hernia   . Motion sickness    cars  . Panic attacks     Past Surgical History:  Procedure Laterality Date  . [place]  . ESOPHAGOGASTRODUODENOSCOPY (EGD) WITH PROPOFOL N/A 09/18/2016   Procedure: ESOPHAGOGASTRODUODENOSCOPY (EGD) WITH PROPOFOL;  Surgeon: Lucilla Lame, MD;  Location: [place];  Service: Endoscopy;  Laterality: N/A;  . HIATAL HERNIA REPAIR N/A 10/21/2016   Procedure: LAPAROSCOPIC REPAIR OF HIATAL HERNIA;  Surgeon: Jules Husbands, MD;  Location: [facility];  Service: General;  Laterality: N/A;  . HIATAL HERNIA REPAIR N/A 10/21/2016   Procedure: HERNIA REPAIR HIATAL;  Surgeon: Jules Husbands, MD;  Location: [facility];  Service: General;  Laterality: N/A;  . TONSILLECTOMY      Current Outpatient Medications on File Prior to Visit  Medication Sig Dispense Refill  . albuterol (PROVENTIL HFA;VENTOLIN HFA) 108 (90 Base) MCG/ACT inhaler Inhale 2 puffs into the lungs every 6 (six) hours as needed for wheezing or shortness of breath.     Marland Kitchen aspirin EC 81 MG tablet Take 81 mg by mouth daily.    . budesonide-formoterol (SYMBICORT) 160-4.5 MCG/ACT inhaler Inhale 2 puffs into the lungs daily.     Marland Kitchen escitalopram (LEXAPRO) 20 MG tablet Take 20 mg by mouth daily.     . fexofenadine (ALLEGRA) 180 MG tablet Take 180 mg by mouth daily as needed for allergies or rhinitis.    Marland Kitchen gabapentin (NEURONTIN) 300 MG capsule Take 1 capsule (300 mg total) by mouth 3 (three) times daily. 90 capsule 0  . omeprazole (PRILOSEC OTC) 20 MG tablet Take 1 tablet (20 mg total) by mouth daily. 90 tablet 3   No current facility-administered medications on file prior to visit.     Allergies  Allergen Reactions  .  Acetaminophen Hives    Pt takes Advil at home for pain    Social History   Socioeconomic History  . Marital status: Divorced    Spouse name: Not on file  . Number of children: Not on file  . Years of education: Not on file  . Highest education level: Not on file  Social Needs  . Financial resource strain: Not on file  . Food insecurity - worry: Not on file  . Food insecurity - inability: Not on file  . Transportation needs - medical: Not on file  . Transportation needs - non-medical: Not on file  Occupational History  . Not on file  Tobacco Use  . Smoking status: Former Smoker    Packs/day: 1.00    Years: 40.00    Pack years: 40.00    Types: Cigarettes    Last  attempt to quit: 05/06/2016    Years since quitting: 1.0  . Smokeless tobacco: Never Used  Substance and Sexual Activity  . Alcohol use: No  . Drug use: No  . Sexual activity: No  Other Topics Concern  . Not on file  Social History Narrative  . Not on file    Family History  Problem Relation Age of Onset  . Diabetes Mother   . Heart disease Mother   . COPD Father     The following portions of the patient's history were reviewed and updated as appropriate: allergies, current medications, past family history, past medical history, past social history, past surgical history and problem list.  Review of Systems Review of Systems  Constitutional: Negative.   HENT: Negative.   Eyes: Negative.   Respiratory: Negative.   Cardiovascular: Negative.   Gastrointestinal: Negative.        Recent repair of hiatal hernia  Genitourinary: Negative.   Musculoskeletal: Negative.   Skin: Negative.   Neurological: Negative.   Psychiatric/Behavioral: Negative.       Objective:   BP 133/74   Pulse (!) 103   Ht 5\' 3"  (1.6 m)   Wt 197 lb 11.2 oz (89.7 kg)   BMI 35.02 kg/m  CONSTITUTIONAL: Well-developed, well-nourished female in no acute distress.  PSYCHIATRIC: Normal mood and affect. Normal behavior. Normal judgment and thought content. [place]: Alert and oriented to person, place, and time. Normal muscle tone coordination. No cranial nerve deficit noted. HENT:  Normocephalic, atraumatic, External right and left ear normal. Oropharynx is clear and moist EYES: Conjunctivae and EOM are normal. No scleral icterus.  NECK: Normal range of motion, supple, no masses.  Normal thyroid.  SKIN: Skin is warm and dry. No rash noted. Not diaphoretic. No erythema. No pallor. CARDIOVASCULAR: Normal heart rate noted, regular rhythm, no murmur. RESPIRATORY: Clear to auscultation bilaterally. Effort and breath sounds normal, no problems with respiration noted. BREASTS: Symmetric in size. No masses, skin  changes, nipple drainage, or lymphadenopathy. ABDOMEN: Soft, normal bowel sounds, no distention noted.  No tenderness, rebound or guarding.  BLADDER: Normal PELVIC:  External Genitalia: Normal  BUS: Normal  Vagina: Moderate to severe atrophy; no discharge; single digit exam is performed  Cervix: Normal; stenotic; no cervical motion tenderness  Uterus: Normal; midplane, not enlarged, nontender  Adnexa: Normal; nonpalpable and nontender  RV: External Exam NormaI, No Rectal Masses and Normal Sphincter tone  MUSCULOSKELETAL: Normal range of motion. No tenderness.  No cyanosis, clubbing, or edema.  2+ distal pulses. LYMPHATIC: No Axillary, Supraclavicular, or Inguinal Adenopathy.    Assessment:   Annual gynecologic examination 67 y.o. Contraception: post  menopausal status [id number] Menopausal state, asymptomatic History of chronic anemia, uncertain source  Plan:  Pap: Pap Co Test Mammogram: Ordered Stool Guaiac Testing:  pcp to schedule colonoscopy soon Labs: thru pcp Routine preventative health maintenance measures emphasized: Exercise/Diet/Weight control, Tobacco Warnings, Alcohol/Substance use risks and Safe Sex Return to [place], CMA  Brayton Mars, MD  Note: This dictation was prepared with Dragon dictation along with smaller phrase technology. Any transcriptional errors that result from this process are unintentional.

## 2017-06-01 LAB — IGP, COBASHPV16/18
HPV 16: NEGATIVE
HPV 18: NEGATIVE
HPV other hr types: NEGATIVE
PAP SMEAR COMMENT: 0

## 2017-08-26 ENCOUNTER — Ambulatory Visit
Admission: RE | Admit: 2017-08-26 | Discharge: 2017-08-26 | Disposition: A | Discharge status: Home / Self Care | Payer: BLUE CROSS/BLUE SHIELD | Source: Ambulatory Visit | Attending: Internal Medicine | Admitting: Internal Medicine

## 2017-08-26 ENCOUNTER — Encounter
Admission: RE | Disposition: A | Discharge status: Home / Self Care | Payer: Self-pay | Source: Ambulatory Visit | Attending: Internal Medicine

## 2017-08-26 ENCOUNTER — Other Ambulatory Visit: Payer: Self-pay

## 2017-08-26 ENCOUNTER — Ambulatory Visit: Payer: BLUE CROSS/BLUE SHIELD | Admitting: Certified Registered Nurse Anesthetist

## 2017-08-26 DIAGNOSIS — K5730 Diverticulosis of large intestine without perforation or abscess without bleeding: Secondary | ICD-10-CM | POA: Insufficient documentation

## 2017-08-26 DIAGNOSIS — D122 Benign neoplasm of ascending colon: Secondary | ICD-10-CM | POA: Insufficient documentation

## 2017-08-26 DIAGNOSIS — K641 Second degree hemorrhoids: Secondary | ICD-10-CM | POA: Diagnosis not present

## 2017-08-26 DIAGNOSIS — Z7982 Long term (current) use of aspirin: Secondary | ICD-10-CM | POA: Insufficient documentation

## 2017-08-26 DIAGNOSIS — Z1211 Encounter for screening for malignant neoplasm of colon: Secondary | ICD-10-CM | POA: Insufficient documentation

## 2017-08-26 DIAGNOSIS — Z886 Allergy status to analgesic agent status: Secondary | ICD-10-CM | POA: Diagnosis not present

## 2017-08-26 DIAGNOSIS — Z79899 Other long term (current) drug therapy: Secondary | ICD-10-CM | POA: Diagnosis not present

## 2017-08-26 DIAGNOSIS — F410 Panic disorder [episodic paroxysmal anxiety] without agoraphobia: Secondary | ICD-10-CM | POA: Insufficient documentation

## 2017-08-26 DIAGNOSIS — K3189 Other diseases of stomach and duodenum: Secondary | ICD-10-CM | POA: Diagnosis not present

## 2017-08-26 DIAGNOSIS — Z882 Allergy status to sulfonamides status: Secondary | ICD-10-CM | POA: Insufficient documentation

## 2017-08-26 DIAGNOSIS — K2950 Unspecified chronic gastritis without bleeding: Secondary | ICD-10-CM | POA: Insufficient documentation

## 2017-08-26 DIAGNOSIS — D509 Iron deficiency anemia, unspecified: Secondary | ICD-10-CM | POA: Diagnosis not present

## 2017-08-26 DIAGNOSIS — K2270 Barrett's esophagus without dysplasia: Secondary | ICD-10-CM | POA: Insufficient documentation

## 2017-08-26 DIAGNOSIS — K210 Gastro-esophageal reflux disease with esophagitis: Secondary | ICD-10-CM | POA: Diagnosis not present

## 2017-08-26 DIAGNOSIS — J449 Chronic obstructive pulmonary disease, unspecified: Secondary | ICD-10-CM | POA: Diagnosis not present

## 2017-08-26 DIAGNOSIS — K621 Rectal polyp: Secondary | ICD-10-CM | POA: Diagnosis not present

## 2017-08-26 DIAGNOSIS — Z87891 Personal history of nicotine dependence: Secondary | ICD-10-CM | POA: Diagnosis not present

## 2017-08-26 HISTORY — PX: COLONOSCOPY WITH PROPOFOL: SHX5780

## 2017-08-26 HISTORY — DX: Chronic obstructive pulmonary disease, unspecified: J44.9

## 2017-08-26 HISTORY — PX: ESOPHAGOGASTRODUODENOSCOPY (EGD) WITH PROPOFOL: SHX5813

## 2017-08-26 SURGERY — COLONOSCOPY WITH PROPOFOL
Anesthesia: General

## 2017-08-26 MED ORDER — PROPOFOL 500 MG/50ML IV EMUL
INTRAVENOUS | Status: AC
  Filled 2017-08-26: qty 50

## 2017-08-26 MED ORDER — PROPOFOL 10 MG/ML IV BOLUS
INTRAVENOUS | Status: DC | PRN
  Administered 2017-08-26: 80 mg via INTRAVENOUS

## 2017-08-26 MED ORDER — SODIUM CHLORIDE 0.9 % IV SOLN
INTRAVENOUS | Status: DC
  Administered 2017-08-26: 08:00:00 via INTRAVENOUS

## 2017-08-26 MED ORDER — PROPOFOL 500 MG/50ML IV EMUL
INTRAVENOUS | Status: DC | PRN
  Administered 2017-08-26: 200 ug/kg/min via INTRAVENOUS

## 2017-08-26 MED ORDER — LIDOCAINE HCL (CARDIAC) PF 100 MG/5ML IV SOSY
PREFILLED_SYRINGE | INTRAVENOUS | Status: DC | PRN
  Administered 2017-08-26: 100 mg via INTRAVENOUS

## 2017-08-26 MED ORDER — PROPOFOL 10 MG/ML IV BOLUS
INTRAVENOUS | Status: AC
  Filled 2017-08-26: qty 20

## 2017-08-26 MED ORDER — GLYCOPYRROLATE 0.2 MG/ML IJ SOLN
INTRAMUSCULAR | Status: AC
  Filled 2017-08-26: qty 1

## 2017-08-26 MED ORDER — LIDOCAINE HCL (PF) 2 % IJ SOLN
INTRAMUSCULAR | Status: AC
  Filled 2017-08-26: qty 10

## 2017-08-26 MED ORDER — GLYCOPYRROLATE 0.2 MG/ML IJ SOLN
INTRAMUSCULAR | Status: DC | PRN
  Administered 2017-08-26: 0.2 mg via INTRAVENOUS

## 2017-08-26 NOTE — Anesthesia Postprocedure Evaluation (Signed)
Anesthesia Post Note  Patient: Yvette Jackson  Procedure(s) Performed: COLONOSCOPY WITH PROPOFOL (N/A ) ESOPHAGOGASTRODUODENOSCOPY (EGD) WITH PROPOFOL (N/A )  Patient location during evaluation: PACU Anesthesia Type: General Level of consciousness: awake and alert Pain management: pain level controlled Vital Signs Assessment: post-procedure vital signs reviewed and stable Respiratory status: spontaneous breathing, nonlabored ventilation, respiratory function stable and patient connected to nasal cannula oxygen Cardiovascular status: blood pressure returned to baseline and stable Postop Assessment: no apparent nausea or vomiting Anesthetic complications: no     Last Vitals:  Vitals:   08/26/17 0908 08/26/17 0918  BP: (!) 169/98 (!) 176/95  Pulse: 89 85  Resp: 17 16  Temp:    SpO2: 96% 97%    Last Pain:  Vitals:   08/26/17 0918  TempSrc:   PainSc: 0-No pain                 Molli Barrows

## 2017-08-26 NOTE — Transfer of Care (Signed)
Immediate Anesthesia Transfer of Care Note  Patient: Yvette Jackson  Procedure(s) Performed: COLONOSCOPY WITH PROPOFOL (N/A ) ESOPHAGOGASTRODUODENOSCOPY (EGD) WITH PROPOFOL (N/A )  Patient Location: PACU and Endoscopy Unit  Anesthesia Type:General  Level of Consciousness: awake  Airway & Oxygen Therapy: Patient Spontanous Breathing  Post-op Assessment: Report given to RN  Post vital signs: stable  Last Vitals:  Vitals Value Taken Time  BP    Temp    Pulse    Resp    SpO2      Last Pain: There were no vitals filed for this visit.       Complications: No apparent anesthesia complications

## 2017-08-26 NOTE — H&P (Signed)
  Outpatient short stay form Pre-procedure 08/26/2017 8:12 AM Nehemiah Mcfarren K. Alice Reichert, M.D.  Primary Physician: Benita Stabile, M.D.  Reason for visit:  Iron deficiency anemia, colon cancer screening  History of present illness:  Patient with hx of possible Barrett's esophagus and GERD presents for workup of Iron deficiency anemia and colon cancer screening.  Patient denies any dysphagia, change in bowel habits, rectal bleeding or melena.  There is no significant nausea or vomiting.  Patient has a remote history of DVT and up until February was taking Xarelto.    Current Facility-Administered Medications:  .  0.9 %  sodium chloride infusion, , Intravenous, Continuous, [place], Benay Pike, MD, Last Rate: 20 mL/hr at 08/26/17 1696  Medications Prior to Admission  Medication Sig Dispense Refill Last Dose  . albuterol (PROVENTIL HFA;VENTOLIN HFA) 108 (90 Base) MCG/ACT inhaler Inhale 2 puffs into the lungs every 6 (six) hours as needed for wheezing or shortness of breath.    Taking  . budesonide-formoterol (SYMBICORT) 160-4.5 MCG/ACT inhaler Inhale 2 puffs into the lungs daily.    Taking  . fexofenadine (ALLEGRA) 180 MG tablet Take 180 mg by mouth daily as needed for allergies or rhinitis.   08/25/2017  . meclizine (ANTIVERT) 25 MG tablet Take 25 mg by mouth 3 (three) times daily as needed for dizziness.     . umeclidinium-vilanterol (ANORO ELLIPTA) 62.5-25 MCG/INH AEPB Inhale 1 puff into the lungs daily.     Marland Kitchen aspirin EC 81 MG tablet Take 81 mg by mouth daily.   08/25/2017  . escitalopram (LEXAPRO) 20 MG tablet Take 20 mg by mouth daily.    08/25/2017  . gabapentin (NEURONTIN) 300 MG capsule Take 1 capsule (300 mg total) by mouth 3 (three) times daily. (Patient taking differently: Take 300 mg by mouth at bedtime. ) 90 capsule 0 08/24/2017  . meclizine (ANTIVERT) 12.5 MG tablet Take 12.5 mg by mouth 3 (three) times daily as needed for dizziness.     Marland Kitchen omeprazole (PRILOSEC OTC) 20 MG tablet Take 1 tablet (20 mg  total) by mouth daily. 90 tablet 3 08/25/2017     Allergies  Allergen Reactions  . Acetaminophen Hives    Pt takes Advil at home for pain  . Sulfasalazine Swelling    Per pt takes Advil at home.      Past Medical History:  Diagnosis Date  . Anxiety   . Bronchitis   . COPD (chronic obstructive pulmonary disease) ([place])   . Difficult intubation   . Dyspnea    with exertion  . Family history of adverse reaction to anesthesia    Sister "died from anesthesia" many years ago during thyroid surgery  . History of hiatal hernia   . Motion sickness    cars  . Panic attacks     Review of systems:   Negative   Physical Exam  Gen: Alert, oriented. Appears stated age.  HEENT: [place]/AT. PERRLA. Lungs: CTA, no wheezes. CV: RR nl S1, S2. Abd: soft, benign, no masses. BS+ Ext: No edema. Pulses 2+    Planned procedures: EGD and colonoscopy. The patient understands the nature of the planned procedure, indications, risks, alternatives and potential complications including but not limited to bleeding, infection, perforation, damage to internal organs and possible oversedation/side effects from anesthesia. The patient agrees and gives consent to proceed.  Please refer to procedure notes for findings, recommendations and patient disposition/instructions.    Aizlynn Digilio K. Alice Reichert, M.D. Gastroenterology 08/26/2017  8:12 AM

## 2017-08-26 NOTE — Op Note (Signed)
[facility] Patient Name: Yvette Jackson Procedure Date: 08/26/2017 8:08 AM MRN: [id number] Account #: [id number] Date of Birth: 06/23/1950 Admit Type: Outpatient Age: 67 Room: [facility] ENDO ROOM 4 Gender: Female Note Status: Finalized Procedure:            Colonoscopy Indications:          Screening for colorectal malignant neoplasm Providers:            Benay Pike. Alice Reichert MD, MD Referring MD:         Leona Carry. Hall Busing, MD (Referring MD) Medicines:            Propofol per Anesthesia Complications:        No immediate complications. Procedure:            Pre-Anesthesia Assessment:                       - The risks and benefits of the procedure and the                        sedation options and risks were discussed with the                        patient. All questions were answered and informed                        consent was obtained.                       - Patient identification and proposed procedure were                        verified prior to the procedure by the nurse. The                        procedure was verified in the procedure room.                       - ASA Grade Assessment: II - A patient with mild                        systemic disease.                       - After reviewing the risks and benefits, the patient                        was deemed in satisfactory condition to undergo the                        procedure.                       After obtaining informed consent, the colonoscope was                        passed under direct vision. Throughout the procedure,                        the patient's blood pressure, pulse, and oxygen  saturations were monitored continuously. The                        Colonoscope was introduced through the anus and                        advanced to the the cecum, identified by appendiceal                        orifice and ileocecal valve. The colonoscopy was            performed without difficulty. The patient tolerated the                        procedure well. The quality of the bowel preparation                        was adequate. The ileocecal valve, appendiceal orifice,                        and rectum were photographed. Findings:      The perianal exam findings include internal hemorrhoids (Grade I).      Multiple small and large-mouthed diverticula were found in the entire       colon. There was no evidence of diverticular bleeding.      Eight sessile polyps were found in the rectum and ascending colon. The       polyps were 3 to 5 mm in size. These polyps were removed with a hot       snare. Resection and retrieval were complete. These polyps were removed       with a hot snare. Resection and retrieval were complete. To prevent       bleeding after the polypectomy, one hemostatic clip was successfully       placed on ascending colon polypectomy site. There was no bleeding       during, or at the end, of the procedure.      Many sessile polyps were found in the rectum (benign-appearing lesion).       The polyps were 2 to 3 mm in size. Fulguration to ablate the lesion by       snare was successful.      Non-bleeding internal hemorrhoids were found during retroflexion. The       hemorrhoids were Grade II (internal hemorrhoids that prolapse but reduce       spontaneously).      The exam was otherwise without abnormality. Impression:           - Internal hemorrhoids (Grade I) found on perianal exam.                       - Moderate diverticulosis in the entire examined colon.                        There was no evidence of diverticular bleeding.                       - Eight 3 to 5 mm polyps in the rectum and in the                        ascending colon, removed with a hot snare. Resected  and                        retrieved.                       - Many benign appearing 2 to 3 mm polyps in the rectum.                        Treated with  a hot snare.                       - Non-bleeding internal hemorrhoids.                       - The examination was otherwise normal. Recommendation:       - Patient has a contact number available for                        emergencies. The signs and symptoms of potential                        delayed complications were discussed with the patient.                        Return to normal activities tomorrow. Written discharge                        instructions were provided to the patient.                       - Resume previous diet.                       - Continue present medications.                       - Repeat colonoscopy is recommended for surveillance.                        The colonoscopy date will be determined after pathology                        results from today's exam become available for review.                       - Await pathology from EGD performed today.                       - Consider VCE if biopsies from EGD are negative for H                        Pylori and/or celiac disease Procedure Code(s):    --- Professional ---                       [phone], Colonoscopy, flexible; with ablation of                        tumor(s), polyp(s), or other lesion(s) (includes pre-                        and post-dilation and  guide wire passage, when                        performed)                       45385, 59, Colonoscopy, flexible; with removal of                        tumor(s), polyp(s), or other lesion(s) by snare                        technique Diagnosis Code(s):    --- Professional ---                       Z12.11, Encounter for screening for malignant neoplasm                        of colon                       K64.1, Second degree hemorrhoids                       K62.1, Rectal polyp                       D12.2, Benign neoplasm of ascending colon                       K57.30, Diverticulosis of large intestine without                        perforation or  abscess without bleeding CPT copyright 2017 American Medical Association. All rights reserved. The codes documented in this report are preliminary and upon coder review may  be revised to meet current compliance requirements. Efrain Sella MD, MD 08/26/2017 8:52:09 AM This report has been signed electronically. Number of Addenda: 0 Note Initiated On: 08/26/2017 8:08 AM Scope Withdrawal Time: 0 hours 11 minutes 5 seconds  Total Procedure Duration: 0 hours 16 minutes 27 seconds       [facility]

## 2017-08-26 NOTE — Anesthesia Preprocedure Evaluation (Addendum)
Anesthesia Evaluation  Patient identified by MRN, date of birth, ID band Patient awake    Reviewed: Allergy & Precautions, H&P , NPO status , Patient's Chart, lab work & pertinent test results, reviewed documented beta blocker date and time   History of Anesthesia Complications (+) DIFFICULT AIRWAY and Family history of anesthesia reaction  Airway Mallampati: II   Neck ROM: full    Dental  (+) Teeth Intact, Poor Dentition   Pulmonary neg pulmonary ROS, shortness of breath, COPD, former smoker,    Pulmonary exam normal        Cardiovascular negative cardio ROS Normal cardiovascular exam Rhythm:regular Rate:Normal     Neuro/Psych Anxiety  Neuromuscular disease negative neurological ROS  negative psych ROS   GI/Hepatic negative GI ROS, Neg liver ROS, hiatal hernia,   Endo/Other  negative endocrine ROS  Renal/GU negative Renal ROS  negative genitourinary   Musculoskeletal   Abdominal   Peds  Hematology negative hematology ROS (+)   Anesthesia Other Findings Past Medical History: No date: Anxiety No date: Bronchitis No date: COPD (chronic obstructive pulmonary disease) ([facility]) No date: Difficult intubation No date: Dyspnea     Comment:  with exertion No date: Family history of adverse reaction to anesthesia     Comment:  Sister "died from anesthesia" many years ago during               thyroid surgery No date: History of hiatal hernia No date: Motion sickness     Comment:  cars No date: Panic attacks Past Surgical History: 1982: CESAREAN SECTION 09/18/2016: ESOPHAGOGASTRODUODENOSCOPY (EGD) WITH PROPOFOL; N/A     Comment:  Procedure: ESOPHAGOGASTRODUODENOSCOPY (EGD) WITH               PROPOFOL;  Surgeon: Lucilla Lame, MD;  Location: [place];  Service: Endoscopy;  Laterality: N/A; 10/21/2016: HIATAL HERNIA REPAIR; N/A     Comment:  Procedure: LAPAROSCOPIC REPAIR OF HIATAL HERNIA;          Surgeon: Jules Husbands, MD;  Location: [facility];                Service: General;  Laterality: N/A; 10/21/2016: HIATAL HERNIA REPAIR; N/A     Comment:  Procedure: HERNIA REPAIR HIATAL;  Surgeon: Jules Husbands, MD;  Location: [facility];  Service: General;                Laterality: N/A; No date: TONSILLECTOMY BMI    Body Mass Index:  32.11 kg/m     Reproductive/Obstetrics negative OB ROS                            Anesthesia Physical Anesthesia Plan  ASA: III  Anesthesia Plan: General   Post-op Pain Management:    Induction:   PONV Risk Score and Plan:   Airway Management Planned:   Additional Equipment:   Intra-op Plan:   Post-operative Plan:   Informed Consent: I have reviewed the patients History and Physical, chart, labs and discussed the procedure including the risks, benefits and alternatives for the proposed anesthesia with the patient or authorized representative who has indicated his/her understanding and acceptance.   Dental Advisory Given  Plan Discussed with: CRNA  Anesthesia Plan Comments:         Anesthesia  Quick Evaluation

## 2017-08-26 NOTE — Interval H&P Note (Signed)
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 After consideration of risks, benefits and other options for treatment, the patient has consented to  Procedure(s): COLONOSCOPY WITH PROPOFOL (N/A) ESOPHAGOGASTRODUODENOSCOPY (EGD) WITH PROPOFOL (N/A) as a surgical intervention .  The patient's history has been reviewed, patient examined, no change in status, stable for surgery.  I have reviewed the patient's chart and labs.  Questions were answered to the patient's satisfaction.     [place], [place]

## 2017-08-26 NOTE — Anesthesia Post-op Follow-up Note (Signed)
Anesthesia QCDR form completed.        

## 2017-08-26 NOTE — Op Note (Signed)
[facility] Gastroenterology Patient Name: Yvette Jackson Procedure Date: 08/26/2017 8:09 AM MRN: [id number] Account #: [id number] Date of Birth: [birth year] Admit Type: Outpatient Age: 67 Room: [facility] ENDO ROOM 4 Gender: Female Note Status: Finalized Procedure:            Upper GI endoscopy Indications:          Suspected upper gastrointestinal bleeding in patient                        with unexplained iron deficiency anemia Providers:            Benay Pike. Alice Reichert MD, MD Referring MD:         Leona Carry. Hall Busing, MD (Referring MD) Medicines:            Propofol per Anesthesia Complications:        No immediate complications. Procedure:            Pre-Anesthesia Assessment:                       - The risks and benefits of the procedure and the                        sedation options and risks were discussed with the                        patient. All questions were answered and informed                        consent was obtained.                       - Patient identification and proposed procedure were                        verified prior to the procedure by the nurse. The                        procedure was verified in the procedure room.                       - ASA Grade Assessment: II - A patient with mild                        systemic disease.                       - After reviewing the risks and benefits, the patient                        was deemed in satisfactory condition to undergo the                        procedure.                       After obtaining informed consent, the endoscope was                        passed under direct vision. Throughout the procedure,  the patient's blood pressure, pulse, and oxygen                        saturations were monitored continuously. The Endoscope                        was introduced through the mouth, and advanced to the                        third part of duodenum. The upper GI endoscopy  was                        accomplished without difficulty. The patient tolerated                        the procedure well. Findings:      LA Grade A (one or more mucosal breaks less than 5 mm, not extending       between tops of 2 mucosal folds) esophagitis with no bleeding was found       in the distal esophagus. Mucosa was biopsied with a cold forceps for       histology. One specimen bottle was sent to pathology.      Localized mildly erythematous mucosa without bleeding was found in the       gastric antrum. Biopsies were taken with a cold forceps for Helicobacter       pylori testing.      The cardia and gastric fundus were normal on retroflexion.      The examined duodenum was normal. Biopsies for histology were taken with       a cold forceps for evaluation of celiac disease. Impression:           - LA Grade A reflux esophagitis. Rule out Barrett's                        esophagus. Biopsied.                       - Erythematous mucosa in the antrum. Biopsied.                       - Normal examined duodenum. Biopsied. Recommendation:       - Await pathology results.                       - Proceed with colonoscopy Procedure Code(s):    --- Professional ---                       [phone], Esophagogastroduodenoscopy, flexible, transoral;                        with biopsy, single or multiple Diagnosis Code(s):    --- Professional ---                       K21.0, Gastro-esophageal reflux disease with esophagitis                       K31.89, Other diseases of stomach and duodenum                       D50.9, Iron deficiency  anemia, unspecified CPT copyright 2017 American Medical Association. All rights reserved. The codes documented in this report are preliminary and upon coder review may  be revised to meet current compliance requirements. Efrain Sella MD, MD 08/26/2017 8:26:09 AM This report has been signed electronically. Number of Addenda: 0 Note Initiated On: 08/26/2017 8:09  AM      [facility]

## 2017-08-27 ENCOUNTER — Encounter: Payer: Self-pay | Admitting: Internal Medicine

## 2017-08-28 LAB — SURGICAL PATHOLOGY

## 2018-01-11 ENCOUNTER — Other Ambulatory Visit: Payer: Self-pay | Admitting: Surgery

## 2018-02-02 ENCOUNTER — Other Ambulatory Visit: Payer: Self-pay | Admitting: Internal Medicine

## 2018-02-02 DIAGNOSIS — Z1231 Encounter for screening mammogram for malignant neoplasm of breast: Secondary | ICD-10-CM

## 2018-02-26 ENCOUNTER — Ambulatory Visit
Admission: RE | Admit: 2018-02-26 | Discharge: 2018-02-26 | Disposition: A | Discharge status: Home / Self Care | Payer: BLUE CROSS/BLUE SHIELD | Source: Ambulatory Visit | Attending: Internal Medicine | Admitting: Internal Medicine

## 2018-02-26 DIAGNOSIS — Z1231 Encounter for screening mammogram for malignant neoplasm of breast: Secondary | ICD-10-CM

## 2018-04-02 IMAGING — DX DG CHEST 1V PORT
1 series · 1 of 1 positions shown · non-contrast
Comparison: None.

CLINICAL DATA: Rhonchi at left lung base s/p

EXAM:
PORTABLE CHEST 1 VIEW

[chest ap]
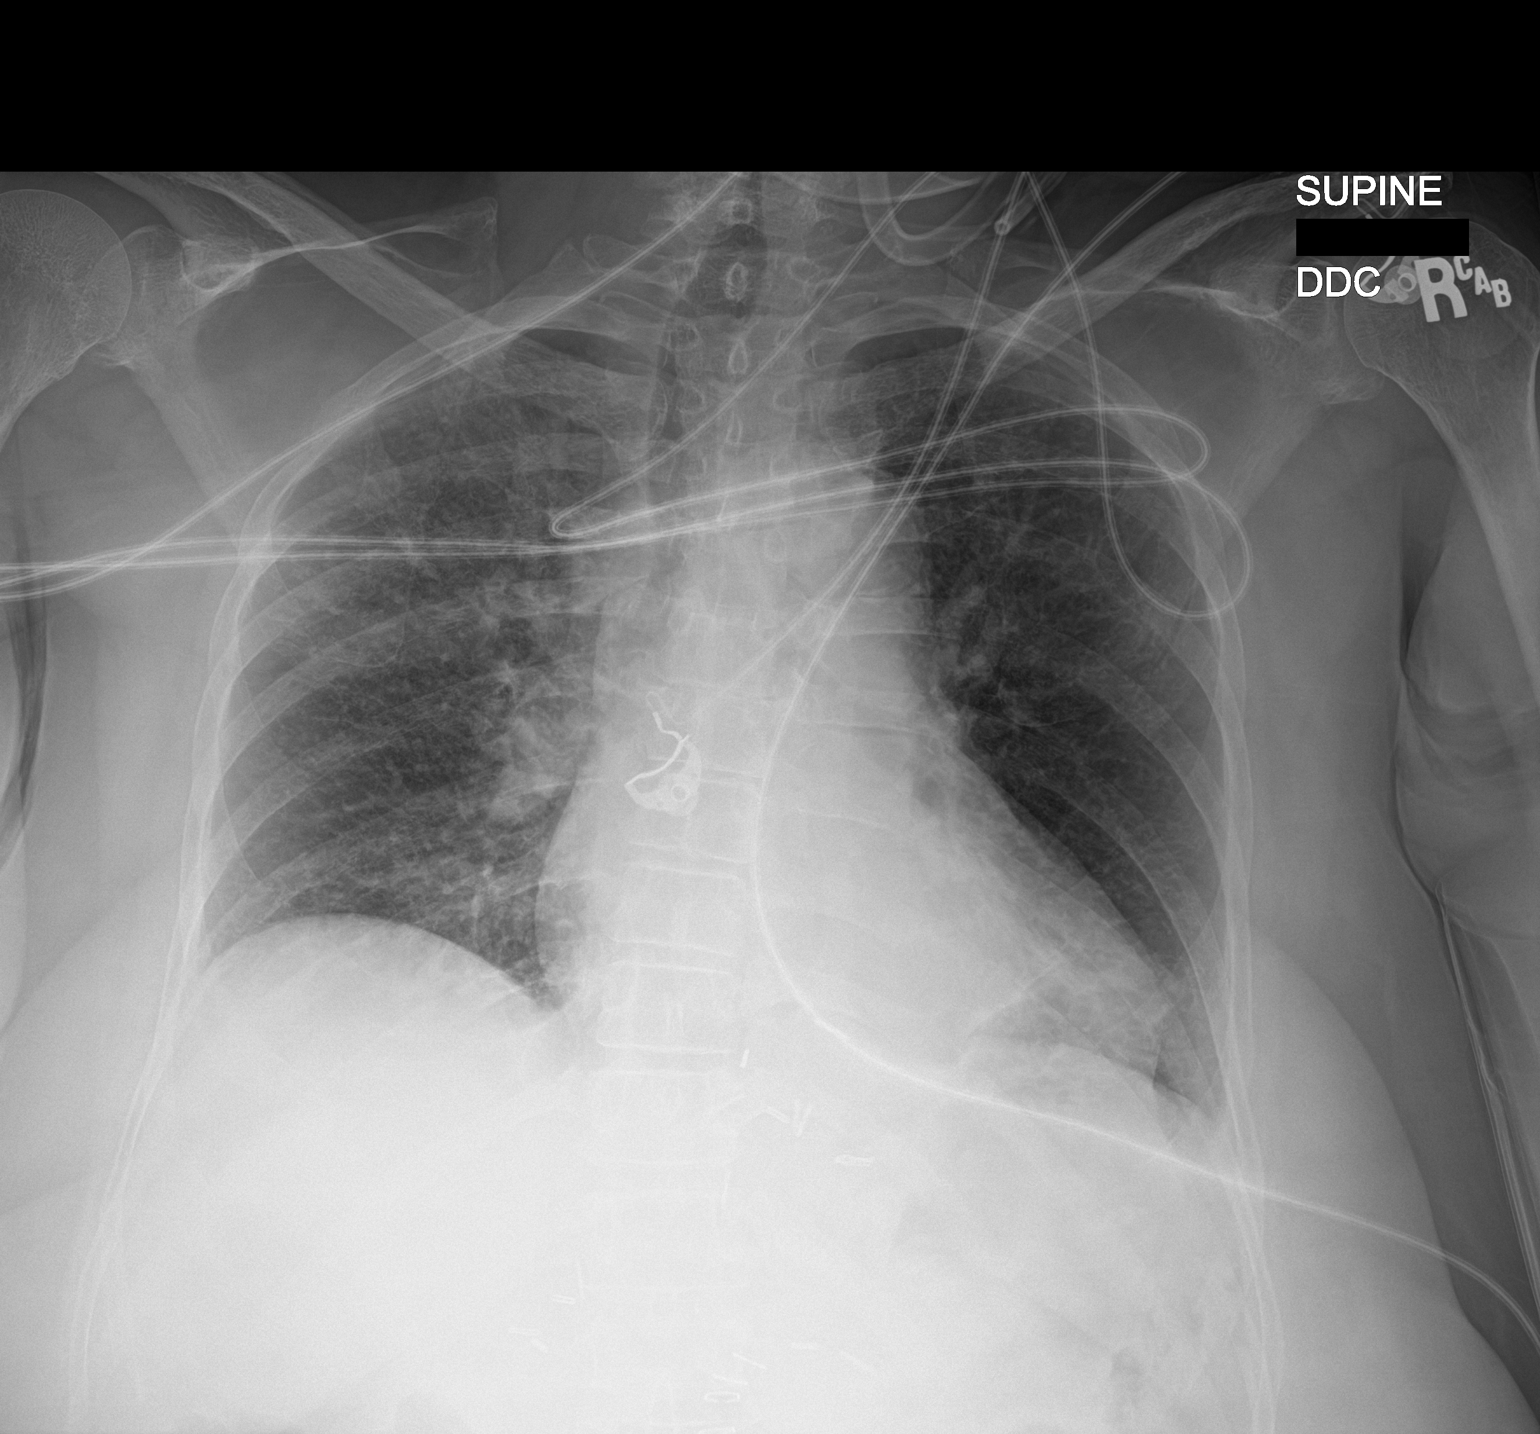

[1 of 1 positions shown; findings below may reference images not displayed]

FINDINGS: Normal mediastinum and cardiac silhouette. Mild increase and upper
lobe linear vascular pattern. LEFT retrocardiac opacity. No
pneumothorax. No acute bony abnormality.
IMPRESSION: 1. LEFT retrocardiac passed representing atelectasis versus
infiltrate.
2. Mild interstitial edema.

## 2018-04-06 IMAGING — DX DG CHEST 1V PORT
1 series · 1 of 1 positions shown · non-contrast
Comparison: Prior radiograph from 10/23/2016.

CLINICAL DATA: Initial evaluation for shortness of breast since
recent hernia repair

EXAM:
PORTABLE CHEST 1 VIEW

[chest ap]
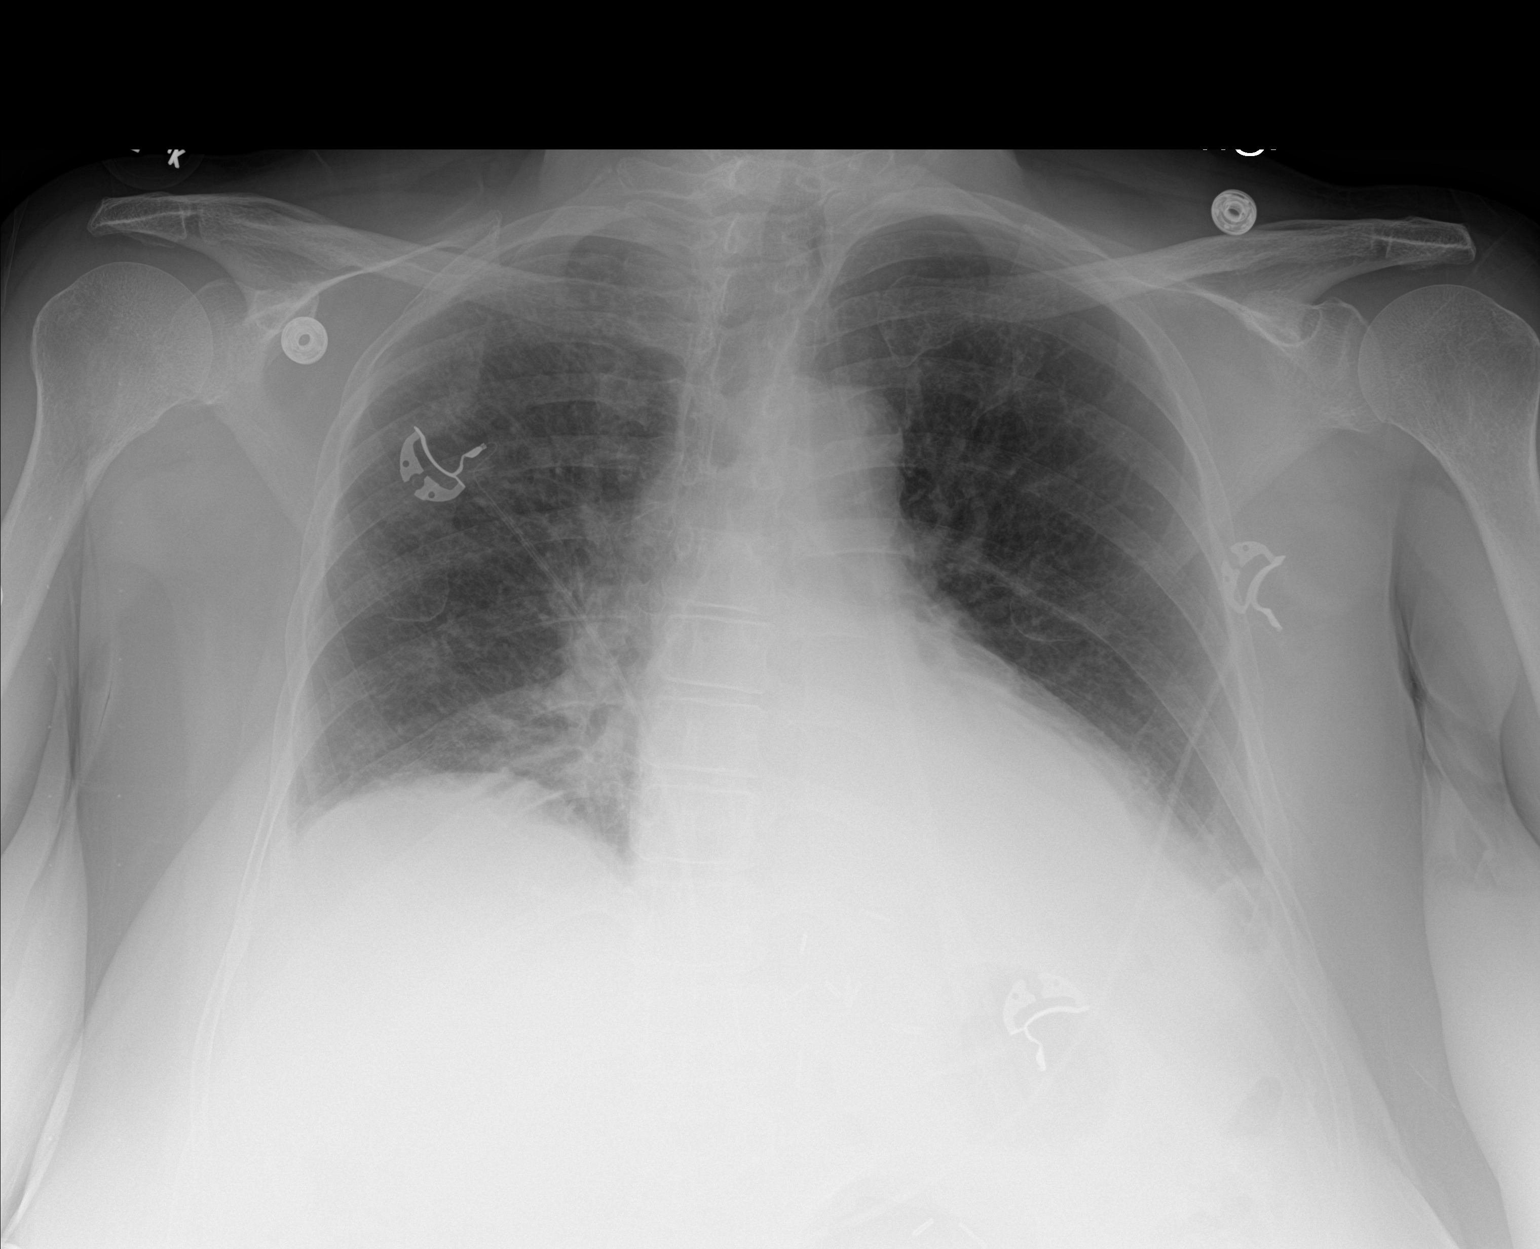

[1 of 1 positions shown; findings below may reference images not displayed]

FINDINGS: Stable cardiomegaly.  Mediastinal silhouette within normal limits.

Lungs hypoinflated. Mild perihilar vascular congestion with
interstitial extent to a shin, similar to previous. Dense
retrocardiac left lower lobe opacity, similar to previous. Patchy
opacity at the medial right lung base, slightly progressed from
previous. Mild blunting of the bilateral costophrenic angles,
suggestive of small effusions. No pneumothorax.

Osseous structures unchanged.
IMPRESSION: 1. Shallow lung inflation with persistent bibasilar opacities,
slightly worsened on the right as compared to previous. Findings may
reflect atelectasis or infiltrates.
2. Probable small bilateral pleural effusions.
3. Stable cardiomegaly with mild pulmonary interstitial congestion.

## 2018-04-09 IMAGING — DX DG CHEST 1V PORT
1 series · 1 of 1 positions shown · non-contrast
Comparison: October 25, 2016

CLINICAL DATA: Pleural effusion

EXAM:
PORTABLE CHEST 1 VIEW

[chest ap]
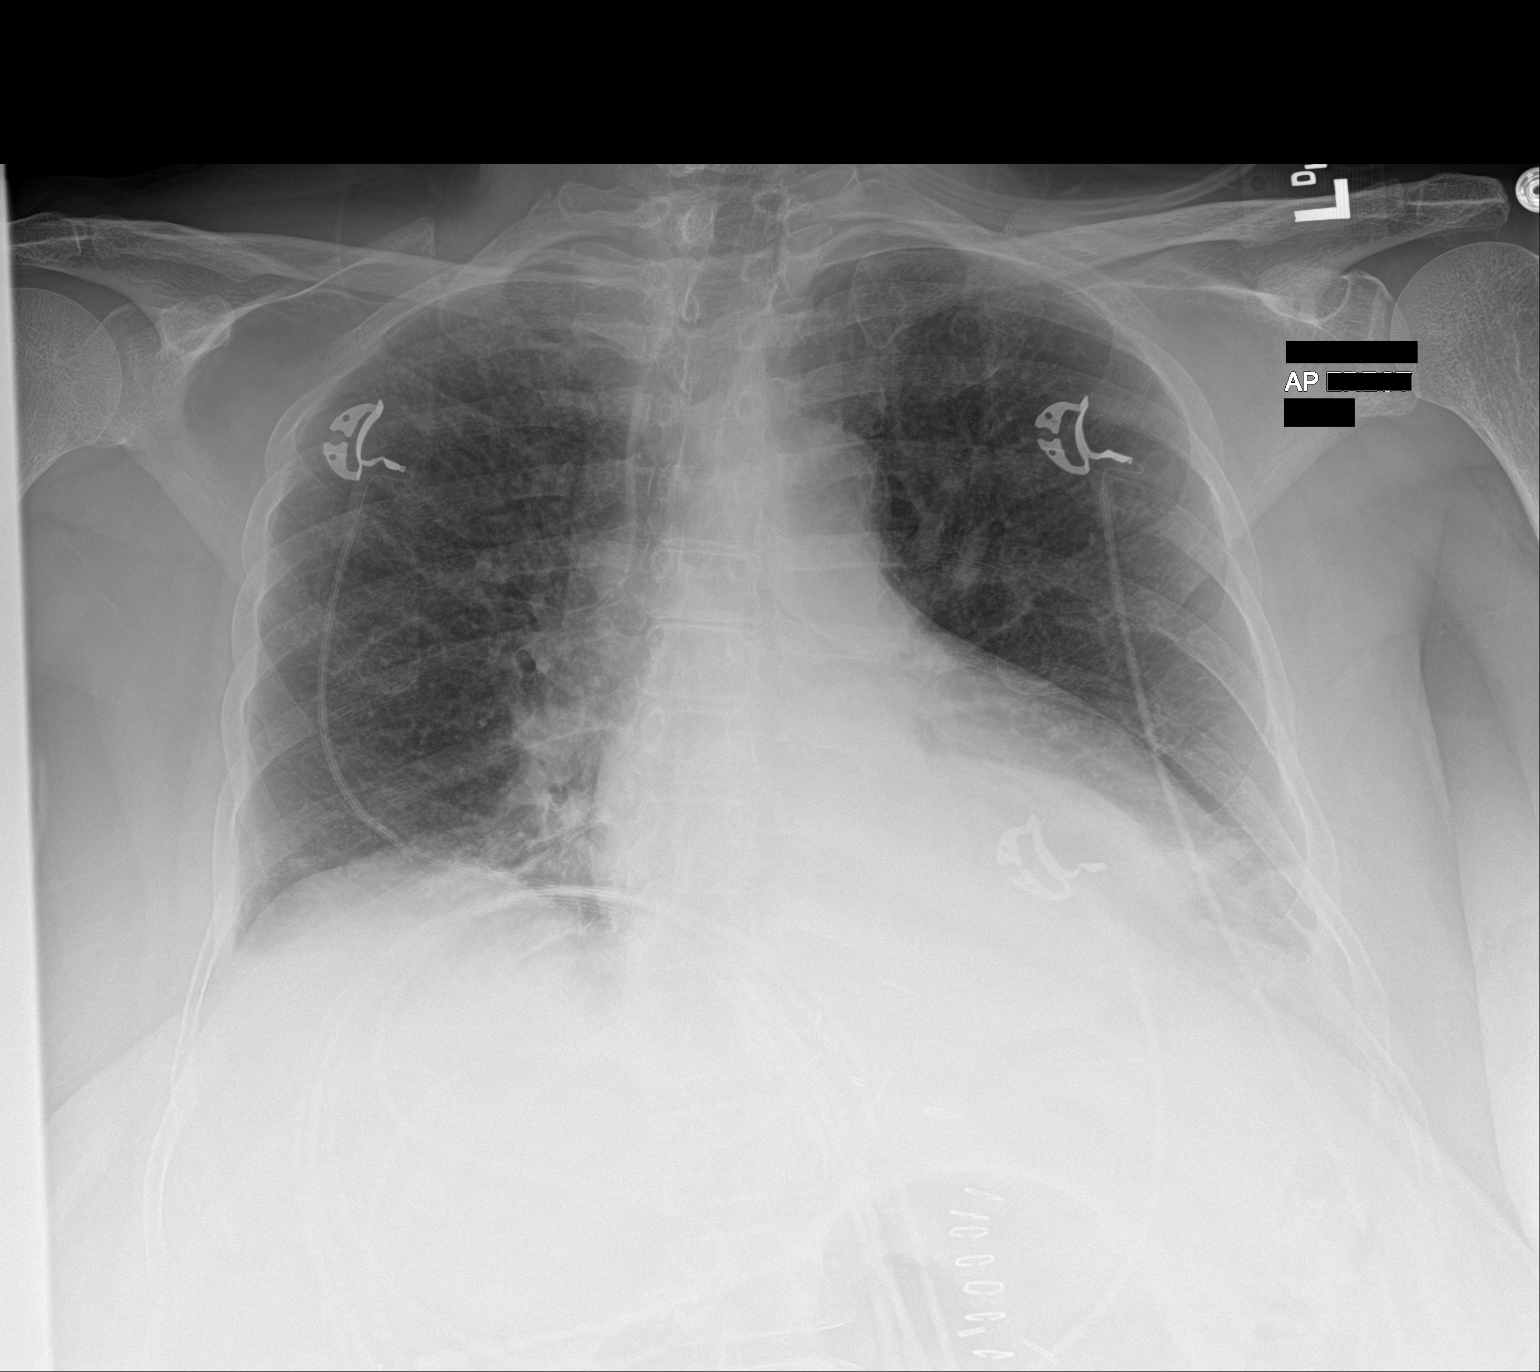

[1 of 1 positions shown; findings below may reference images not displayed]

FINDINGS: There is a small left pleural effusion with left base atelectasis.
Lungs elsewhere clear. Heart is upper normal in size with pulmonary
vascularity within normal limits. There is aortic atherosclerosis.
There is a sizable paraesophageal type hernia. No adenopathy. No
bone lesions peer
IMPRESSION: Small left pleural effusion with left base atelectasis. Lungs
elsewhere clear. Stable cardiac silhouette. Sizable paraesophageal
hernia present. There is aortic atherosclerosis.

Aortic Atherosclerosis (LHV81-XLS.S).

## 2018-06-01 ENCOUNTER — Encounter: Payer: BLUE CROSS/BLUE SHIELD | Admitting: Obstetrics and Gynecology

## 2019-01-31 ENCOUNTER — Other Ambulatory Visit: Payer: Self-pay | Admitting: Internal Medicine

## 2019-01-31 DIAGNOSIS — Z1231 Encounter for screening mammogram for malignant neoplasm of breast: Secondary | ICD-10-CM

## 2019-03-02 ENCOUNTER — Ambulatory Visit
Admission: RE | Admit: 2019-03-02 | Discharge: 2019-03-02 | Disposition: A | Discharge status: Home / Self Care | Payer: BC Managed Care – PPO | Source: Ambulatory Visit | Attending: Internal Medicine | Admitting: Internal Medicine

## 2019-03-02 DIAGNOSIS — Z1231 Encounter for screening mammogram for malignant neoplasm of breast: Secondary | ICD-10-CM | POA: Diagnosis not present

## 2020-03-08 ENCOUNTER — Ambulatory Visit (INDEPENDENT_AMBULATORY_CARE_PROVIDER_SITE_OTHER): Payer: BC Managed Care – PPO | Admitting: Obstetrics and Gynecology

## 2020-03-08 ENCOUNTER — Other Ambulatory Visit: Payer: Self-pay

## 2020-03-08 ENCOUNTER — Encounter: Payer: Self-pay | Admitting: Obstetrics and Gynecology

## 2020-03-08 VITALS — BP 112/70 | HR 89 | Ht 64.5 in | Wt 206.9 lb

## 2020-03-08 DIAGNOSIS — N952 Postmenopausal atrophic vaginitis: Secondary | ICD-10-CM

## 2020-03-08 NOTE — Progress Notes (Signed)
    GYNECOLOGY PROGRESS NOTE  Subjective:    Patient ID: Yvette Jackson, female    DOB: 1951/02/20, 69 y.o.   MRN: [id number]  HPI  Patient is a 69 y.o. G27P1001 female who presents for complaints of itching in the vaginal area for several weeks. Has tried some over the counter products which has not helped. Is not sexually active. Does note changing soaps recently (same brand but different type). Does not appreciate any discharge. Denies vaginal bleeding.   The following portions of the patient's history were reviewed and updated as appropriate: allergies, current medications, past family history, past medical history, past social history, past surgical history and problem list.  Review of Systems Pertinent items noted in HPI and remainder of comprehensive ROS otherwise negative.   Objective:   Blood pressure 112/70, pulse 89, height 5' 4.5" (1.638 m), weight 206 lb 14.4 oz (93.8 kg). General appearance: alert and no distress Abdomen: soft, non-tender; bowel sounds normal; no masses,  no organomegaly Pelvic: external genitalia normal, rectovaginal septum normal.  Vagina with scant thin white discharge. Moderate vaginal atrophy present. Cervix normal appearing, no lesions and no motion tenderness.  Uterus mobile, nontender, normal shape and size.  Adnexae non-palpable, nontender bilaterally.  Extremities: extremities normal, atraumatic, no cyanosis or edema Neurologic: Grossly normal   Microscopic wet-mount exam shows negative for pathogens, normal epithelial cells.    Assessment:   Atrophic vaginitis  Plan:   - Atrophic vaginitis suspected as no vaginal infection. Discussed management options including vaginal estrogen, testosterone therapy, or vaginal moisturizers.  Patient opted to trial vaginal estrogen, given samples of cream.  - RTC in 2 weeks for f/u of symptoms.

## 2020-03-08 NOTE — Patient Instructions (Signed)
Atrophic Vaginitis  Atrophic vaginitis is a condition in which the tissues that line the vagina become dry and thin. This condition is most common in women who have stopped having regular menstrual periods (are in menopause). This usually starts when a woman is 45-68 years old. That is the time when a woman's estrogen levels begin to drop (decrease). Estrogen is a female hormone. It helps to keep the tissues of the vagina moist. It stimulates the vagina to produce a clear fluid that lubricates the vagina for sexual intercourse. This fluid also protects the vagina from infection. Lack of estrogen can cause the lining of the vagina to get thinner and dryer. The vagina may also shrink in size. It may become less elastic. Atrophic vaginitis tends to get worse over time as a woman's estrogen level drops. What are the causes? This condition is caused by the normal drop in estrogen that happens around the time of menopause. What increases the risk? Certain conditions or situations may lower a woman's estrogen level, leading to a higher risk for atrophic vaginitis. You are more likely to develop this condition if:  You are taking medicines that block estrogen.  You have had your ovaries removed.  You are being treated for cancer with X-ray (radiation) or medicines (chemotherapy).  You have given birth or are breastfeeding.  You are older than age 50.  You smoke. What are the signs or symptoms? Symptoms of this condition include:  Pain, soreness, or bleeding during sexual intercourse (dyspareunia).  Vaginal burning, irritation, or itching.  Pain or bleeding when a speculum is used in a vaginal exam (pelvic exam).  Having burning pain when passing urine.  Vaginal discharge that is brown or yellow. In some cases, there are no symptoms. How is this diagnosed? This condition is diagnosed by taking a medical history and doing a physical exam. This will include a pelvic exam that checks the  vaginal tissues. Though rare, you may also have other tests, including:  A urine test.  A test that checks the acid balance in your vagina (acid balance test). How is this treated? Treatment for this condition depends on how severe your symptoms are. Treatment may include:  Using an over-the-counter vaginal lubricant before sex.  Using a long-acting vaginal moisturizer.  Using low-dose vaginal estrogen for moderate to severe symptoms that do not respond to other treatments. Options include creams, tablets, and inserts (vaginal rings). Before you use a vaginal estrogen, tell your health care provider if you have a history of: ? Breast cancer. ? Endometrial cancer. ? Blood clots. If you are not sexually active and your symptoms are very mild, you may not need treatment. Follow these instructions at home: Medicines  Take over-the-counter and prescription medicines only as told by your health care provider. Do not use herbal or alternative medicines unless your health care provider says that you can.  Use over-the-counter creams, lubricants, or moisturizers for dryness only as directed by your health care provider. General instructions  If your atrophic vaginitis is caused by menopause, discuss all of your menopause symptoms and treatment options with your health care provider.  Do not douche.  Do not use products that can make your vagina dry. These include: ? Scented feminine sprays. ? Scented tampons. ? Scented soaps.  Vaginal intercourse can help to improve blood flow and elasticity of vaginal tissue. If it hurts to have sex, try using a lubricant or moisturizer just before having intercourse. Contact a health care provider if:    Your discharge looks different than normal.  Your vagina has an unusual smell.  You have new symptoms.  Your symptoms do not improve with treatment.  Your symptoms get worse. Summary  Atrophic vaginitis is a condition in which the tissues that  line the vagina become dry and thin. It is most common in women who have stopped having regular menstrual periods (are in menopause).  Treatment options include using vaginal lubricants and low-dose vaginal estrogen.  Contact a health care provider if your vagina has an unusual smell, or if your symptoms get worse or do not improve after treatment. This information is not intended to replace advice given to you by your health care provider. Make sure you discuss any questions you have with your health care provider. Document Revised: 03/20/2017 Document Reviewed: 01/01/2017 Elsevier Patient Education  2020 Elsevier Inc.  

## 2020-03-08 NOTE — Progress Notes (Signed)
Pt present for possible yeast infection. Pt stated that she has noticed itching in the vaginal area for a couple of weeks and have tried treating the area with OTC medication and still having issues.

## 2020-03-22 ENCOUNTER — Ambulatory Visit (INDEPENDENT_AMBULATORY_CARE_PROVIDER_SITE_OTHER): Payer: BC Managed Care – PPO | Admitting: Obstetrics and Gynecology

## 2020-03-22 ENCOUNTER — Encounter: Payer: Self-pay | Admitting: Obstetrics and Gynecology

## 2020-03-22 ENCOUNTER — Other Ambulatory Visit: Payer: Self-pay

## 2020-03-22 VITALS — BP 116/71 | HR 87 | Ht 64.5 in | Wt 206.0 lb

## 2020-03-22 DIAGNOSIS — N952 Postmenopausal atrophic vaginitis: Secondary | ICD-10-CM

## 2020-03-22 MED ORDER — ESTRADIOL 10 MCG VA TABS: 1.0000 | ORAL_TABLET | VAGINAL | 3 refills | Status: DC

## 2020-03-22 NOTE — Progress Notes (Signed)
Pt present for follow up for vaginal atrophy. Pt stated that she has noticed some improvement, but do not like the sample of cream pt stated that it was too messy.

## 2020-03-22 NOTE — Progress Notes (Signed)
    GYNECOLOGY PROGRESS NOTE  Subjective:    Patient ID: Yvette Jackson, female    DOB: Jan 20, 1951, 69 y.o.   MRN: [id number]  HPI  Patient is a 69 y.o. G16P1001 female who presents for 2 week f/u of atrophic vaginitis.  Patient was given samples of Premarin cream last visit.  She states that she has started to note some improvement, but is sometimes forgetting to use the cream.  Also notes that the cream is a bit messy, and desires to discuss other options again.   The following portions of the patient's history were reviewed and updated as appropriate: allergies, current medications, past family history, past medical history, past social history, past surgical history and problem list.  Review of Systems Pertinent items noted in HPI and remainder of comprehensive ROS otherwise negative.   Objective:   Blood pressure 116/71, pulse 87, height 5' 4.5" (1.638 m), weight 206 lb (93.4 kg). General appearance: alert and no distress Remainder of exam deferred.    Assessment:   1. Atrophic vaginitis    Plan:   -Discussed other treatment options for atrophic vaginitis including vaginal estrogen tablets or ring.  Also discussed the option of testosterone tablets [place]), however there is a concern for insurance as patient has Medicare.  Patient thinks she would like to try the vaginal tablets.  Prescribed Vagifem 10 mg.  Patient is to use the medication daily for the first 2 weeks, and then decrease to twice a week after this.  Patient advised that this is the same regimen as the Premarin cream.  Patient reassures that she will be more regimented at this time.  She will follow up in the office in 2 to 3 weeks to reassess her symptoms again.   Rubie Maid, MD [facility]

## 2020-03-22 NOTE — Patient Instructions (Addendum)
Estradiol vaginal tablets What is this medicine? ESTRADIOL (es tra DYE ole) vaginal tablet is used to help relieve symptoms of vaginal irritation and dryness that occurs in some women during menopause. This medicine may be used for other purposes; ask your health care provider or pharmacist if you have questions. COMMON BRAND NAME(S): Vagifem What should I tell my health care provider before I take this medicine? They need to know if you have any of these conditions:  abnormal vaginal bleeding  blood vessel disease or blood clots  breast, cervical, endometrial, ovarian, liver, or uterine cancer  dementia  diabetes  gallbladder disease  heart disease or recent heart attack  high blood pressure  high cholesterol  high level of calcium in the blood  hysterectomy  kidney disease  liver disease  migraine headaches  protein C deficiency  protein S deficiency  stroke  systemic lupus erythematosus (SLE)  tobacco smoker  an unusual or allergic reaction to estrogens, other hormones, medicines, foods, dyes, or preservatives  pregnant or trying to get pregnant  breast-feeding How should I use this medicine? This medicine is only for use in the vagina. Do not take by mouth. Wash and dry your hands before and after use. Read package directions carefully. Unwrap the applicator package. Be sure to use a new applicator for each dose. Use at the same time each day. If the tablet has fallen out of the applicator, but is still in the package, carefully place it back into the applicator. If the tablet has fallen out of the package, that applicator should be thrown out and you should use a new applicator containing a new tablet. Lie on your back, part and bend your knees. Gently insert the applicator as far as comfortably possible into the vagina. Then, gently press the plunger until the plunger is fully depressed. This will release the tablet into the vagina. Gently remove the  applicator. Throw away the applicator after use. Do not use your medicine more often than directed. Do not stop using except on the advice of your doctor or health care professional. Talk to your pediatrician regarding the use of this medicine in children. This medicine is not approved for use in children. A patient package insert for the product will be given with each prescription and refill. Read this sheet carefully each time. The sheet may change frequently. Overdosage: If you think you have taken too much of this medicine contact a poison control center or emergency room at once. NOTE: This medicine is only for you. Do not share this medicine with others. What if I miss a dose? If you miss a dose, take it as soon as you can. If it is almost time for your next dose, take only that dose. Do not take double or extra doses. What may interact with this medicine? Do not take this medicine with any of the following medications:  aromatase inhibitors like aminoglutethimide, anastrozole, exemestane, letrozole, testolactone This medicine may also interact with the following medications:  antibiotics used to treat tuberculosis like rifabutin, rifampin and rifapentene  raloxifene or tamoxifen  warfarin This list may not describe all possible interactions. Give your health care provider a list of all the medicines, herbs, non-prescription drugs, or dietary supplements you use. Also tell them if you smoke, drink alcohol, or use illegal drugs. Some items may interact with your medicine. What should I watch for while using this medicine? Visit your health care professional for regular checks on your progress. You will need  a regular breast and pelvic exam. You should also discuss the need for regular mammograms with your health care professional, and follow his or her guidelines. This medicine can make your body retain fluid, making your fingers, hands, or ankles swell. Your blood pressure can go up.  Contact your doctor or health care professional if you feel you are retaining fluid. If you have any reason to think you are pregnant; stop taking this medicine at once and contact your doctor or health care professional. Tobacco smoking increases the risk of getting a blood clot or having a stroke, especially if you are more than 68 years old. You are strongly advised not to smoke. If you wear contact lenses and notice visual changes, or if the lenses begin to feel uncomfortable, consult your eye care specialist. If you are going to have elective surgery, you may need to stop taking this medicine beforehand. Consult your health care professional for advice prior to scheduling the surgery. What side effects may I notice from receiving this medicine? Side effects that you should report to your doctor or health care professional as soon as possible:  allergic reactions like skin rash, itching or hives, swelling of the face, lips, or tongue  breast tissue changes or discharge  changes in vision  chest pain  confusion, trouble speaking or understanding  dark urine  general ill feeling or flu-like symptoms  light-colored stools  nausea, vomiting  pain, swelling, warmth in the leg  right upper belly pain  severe headaches  shortness of breath  sudden numbness or weakness of the face, arm or leg  trouble walking, dizziness, loss of balance or coordination  unusual vaginal bleeding  yellowing of the eyes or skin Side effects that usually do not require medical attention (report to your doctor or health care professional if they continue or are bothersome):  hair loss  increased hunger or thirst  increased urination  symptoms of vaginal infection like itching, irritation or unusual discharge  unusually weak or tired This list may not describe all possible side effects. Call your doctor for medical advice about side effects. You may report side effects to FDA at  1-800-FDA-1088. Where should I keep my medicine? Keep out of the reach of children. Store at room temperature between 15 and 30 degrees C (59 and 86 degrees F). Throw away any unused medicine after the expiration date. NOTE: This sheet is a summary. It may not cover all possible information. If you have questions about this medicine, talk to your doctor, pharmacist, or health care provider.  2020 Elsevier/Gold Standard (2014-03-22 09:22:51) 1.

## 2020-03-23 DIAGNOSIS — N952 Postmenopausal atrophic vaginitis: Secondary | ICD-10-CM | POA: Insufficient documentation

## 2020-04-09 NOTE — Progress Notes (Signed)
Follow up for vaginal atrophy. Pt stated that she has noticed some changes in her symptoms since starting this medication.

## 2020-04-10 ENCOUNTER — Ambulatory Visit (INDEPENDENT_AMBULATORY_CARE_PROVIDER_SITE_OTHER): Payer: BC Managed Care – PPO | Admitting: Obstetrics and Gynecology

## 2020-04-10 ENCOUNTER — Encounter: Payer: Self-pay | Admitting: Obstetrics and Gynecology

## 2020-04-10 VITALS — BP 136/87 | HR 88 | Ht 64.5 in | Wt 205.6 lb

## 2020-04-10 DIAGNOSIS — N952 Postmenopausal atrophic vaginitis: Secondary | ICD-10-CM

## 2020-04-10 NOTE — Progress Notes (Signed)
    GYNECOLOGY PROGRESS NOTE  Subjective:    Patient ID: Yvette Jackson, female    DOB: 05-30-1950, 69 y.o.   MRN: [id number]  HPI  Patient is a 69 y.o. G66P1001 female who presents for follow up of vaginal atrophy. Was changed from Premarin cream to Vagifem due to non-compliance (forgetfulness and being messy cream).  Has only been on Vagifem for x 1 week due to not being available at pharmacy.  Is noting some changes with her symptoms already.   The following portions of the patient's history were reviewed and updated as appropriate: allergies, current medications, past family history, past medical history, past social history, past surgical history and problem list.  Review of Systems Pertinent items noted in HPI and remainder of comprehensive ROS otherwise negative.   Objective:   Blood pressure 136/87, pulse 88, height 5' 4.5" (1.638 m), weight 205 lb 9.6 oz (93.3 kg). General appearance: alert and no distress Remainder of exam deferred.    Assessment:   Vaginal atrophy  Plan:   Better compliance noted with the Vagifem.  Will continue this regimen.  RTC in 6 weeks for follow up.

## 2020-05-21 ENCOUNTER — Telehealth: Payer: Self-pay

## 2020-05-21 NOTE — Telephone Encounter (Signed)
Patient called in stating that she had called earlier and has not heard anything yet. Informed patient that we have 24-72 hours for the providers nurse to get back in touch with the patients. Gave patient the option of coming in for a urine drop off in which she declined at this time, informed patient that the first available is what she was scheduled for which is Wednesday. Patient became upset on the phone and began to raise her voice, stating that "I told you earlier I couldn't come in for a urine sample, I cant wait that long". Informed patient that she did not speak with me earlier and that I understand she was uncomfortable but I have to get clearance from her provider to put her on as her provider is booked for Tuesday and is out of the office on Monday. Informed patient that she could contact her primary care or she could go to an urgent care. Informed patient that the best that I could do was to send a message back to her providers nurse but her nurse is with patients and her provider is out of the office today. Patient verbalized understanding. Could you please advise??

## 2020-05-21 NOTE — Telephone Encounter (Signed)
Pt called in and stated that on Saturday she started having uti symptoms. The pt was asked to come in and drop a urine sample off but she says that she is running a temp, and it feels like a fever. I told the pt I will send a message to the nurse. The pt verbally understood, Please advise.

## 2020-05-22 NOTE — Telephone Encounter (Signed)
Pt called no answer LM via VM that I was calling to speak more about her call to the office yesterday. Advised pt to please contact the office; name and phone number left on vm.

## 2020-05-23 ENCOUNTER — Encounter: Payer: Medicare Other | Admitting: Obstetrics and Gynecology

## 2020-05-24 ENCOUNTER — Encounter: Payer: Medicare Other | Admitting: Obstetrics and Gynecology

## 2021-01-04 ENCOUNTER — Other Ambulatory Visit: Payer: Self-pay | Admitting: Internal Medicine

## 2021-01-04 DIAGNOSIS — Z1231 Encounter for screening mammogram for malignant neoplasm of breast: Secondary | ICD-10-CM

## 2021-01-17 ENCOUNTER — Other Ambulatory Visit: Payer: Self-pay

## 2021-01-17 ENCOUNTER — Ambulatory Visit
Admission: RE | Admit: 2021-01-17 | Discharge: 2021-01-17 | Disposition: A | Discharge status: Home / Self Care | Payer: BC Managed Care – PPO | Source: Ambulatory Visit | Attending: Internal Medicine | Admitting: Internal Medicine

## 2021-01-17 DIAGNOSIS — Z1231 Encounter for screening mammogram for malignant neoplasm of breast: Secondary | ICD-10-CM

## 2022-02-17 ENCOUNTER — Other Ambulatory Visit: Payer: Self-pay | Admitting: Internal Medicine

## 2022-02-17 DIAGNOSIS — Z1231 Encounter for screening mammogram for malignant neoplasm of breast: Secondary | ICD-10-CM

## 2022-03-10 ENCOUNTER — Ambulatory Visit
Admission: RE | Admit: 2022-03-10 | Discharge: 2022-03-10 | Disposition: A | Discharge status: Home / Self Care | Payer: BC Managed Care – PPO | Source: Ambulatory Visit | Attending: Internal Medicine | Admitting: Internal Medicine

## 2022-03-10 DIAGNOSIS — Z1231 Encounter for screening mammogram for malignant neoplasm of breast: Secondary | ICD-10-CM | POA: Diagnosis present

## 2022-06-29 IMAGING — MG MM DIGITAL SCREENING BILAT W/ TOMO AND CAD
6 of 10 series · 6 of 30 positions shown · non-contrast
Comparison: Previous exam(s).

ACR Breast Density Category a: The breast tissue is almost entirely
fatty.

CLINICAL DATA: Screening.

EXAM:
DIGITAL SCREENING BILATERAL MAMMOGRAM WITH TOMOSYNTHESIS AND CAD
TECHNIQUE: Bilateral screening digital craniocaudal and mediolateral oblique
mammograms were obtained. Bilateral screening digital breast
tomosynthesis was performed. The images were evaluated with
computer-aided detection.

[L CC synth-2D]
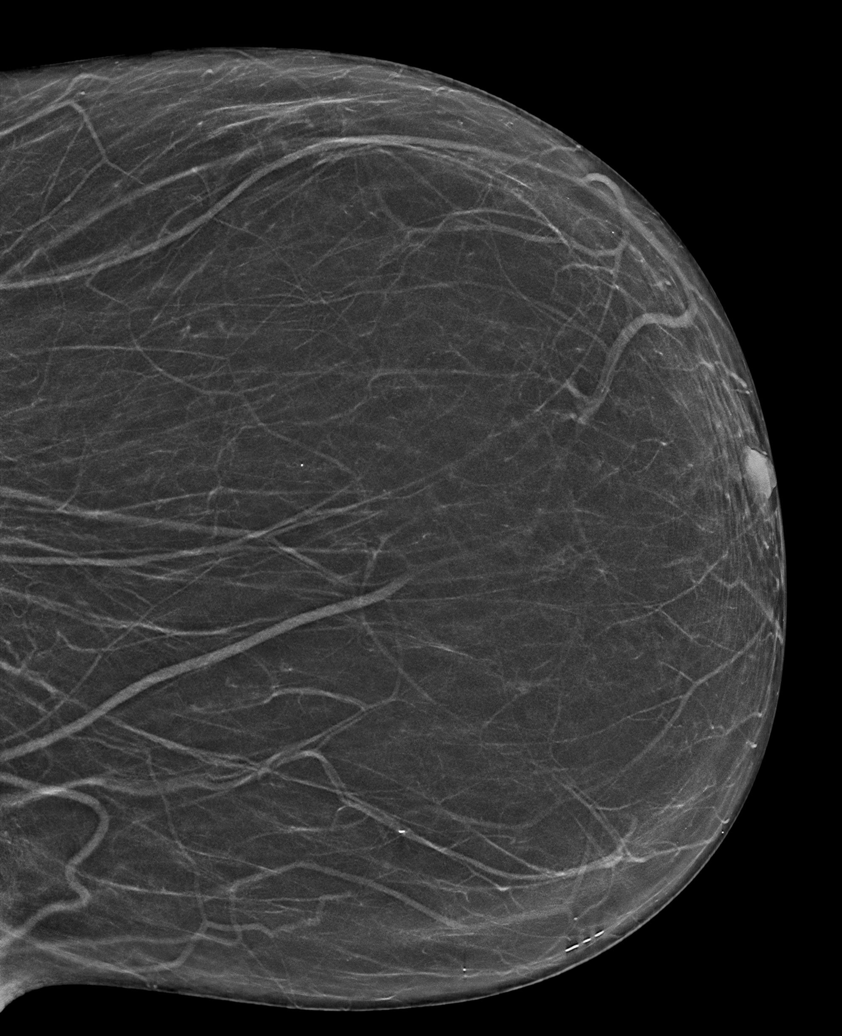

[R CC synth-2D]
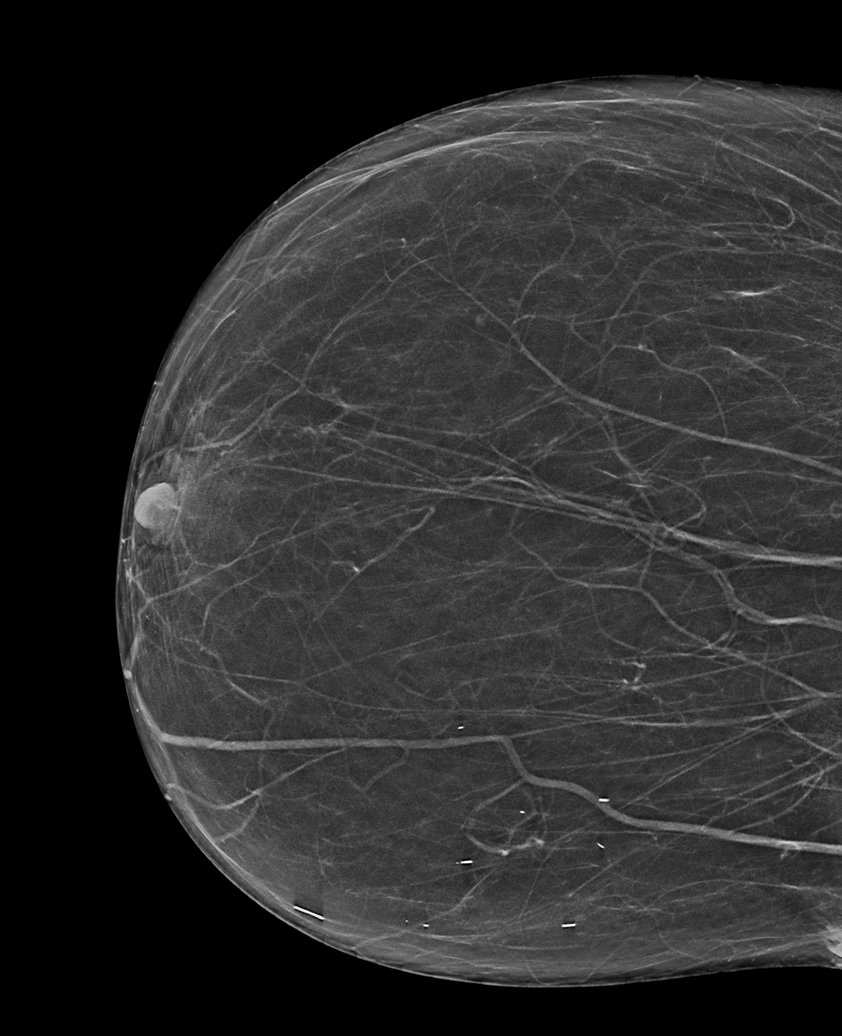

[L MLO synth-2D (1 of 2)]
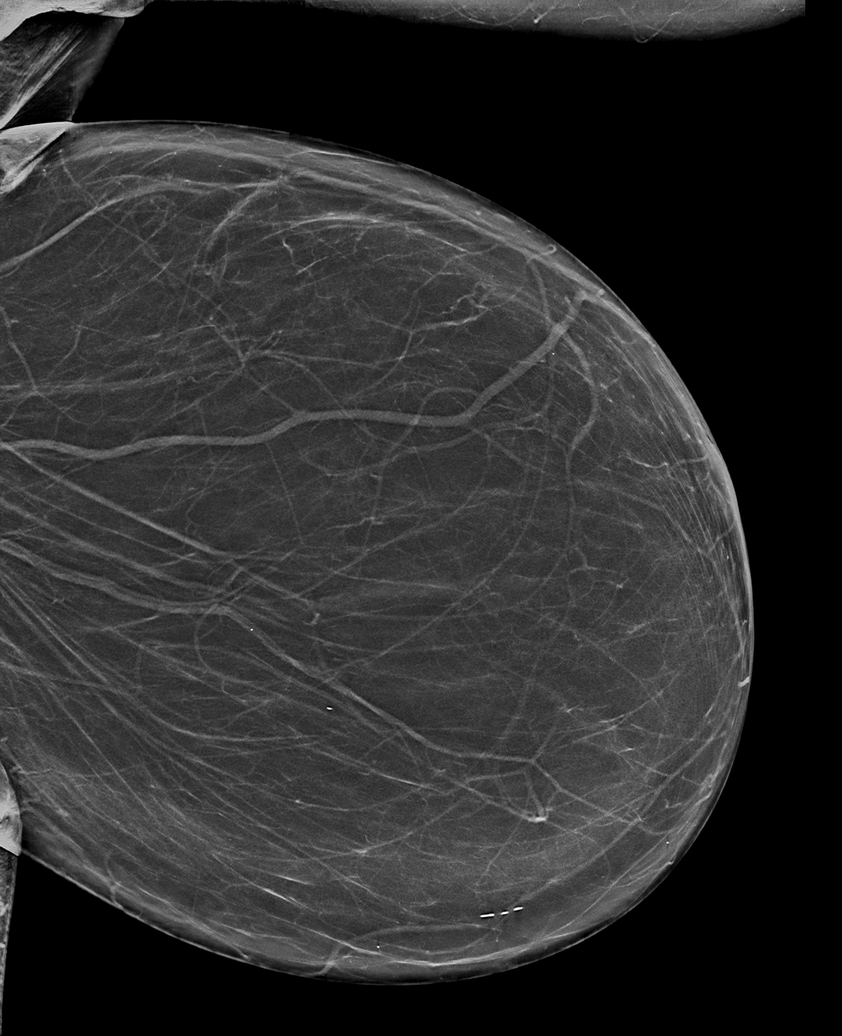

[L MLO synth-2D (2 of 2)]
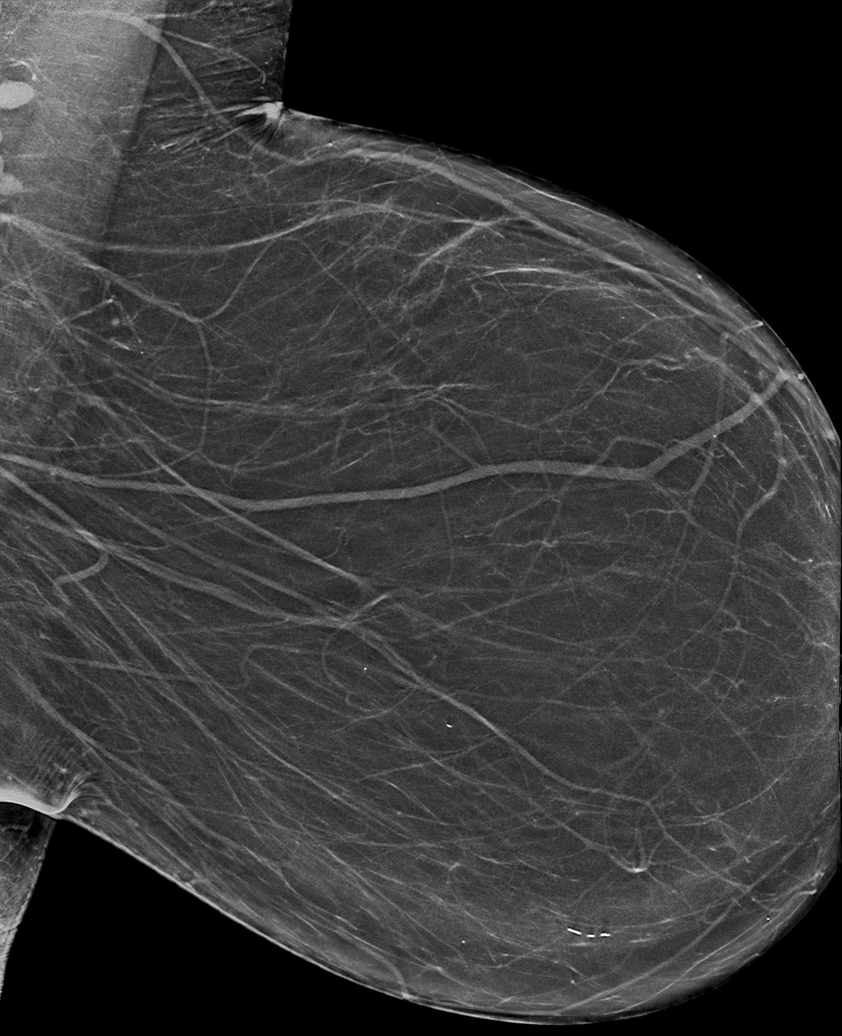

[R MLO synth-2D]
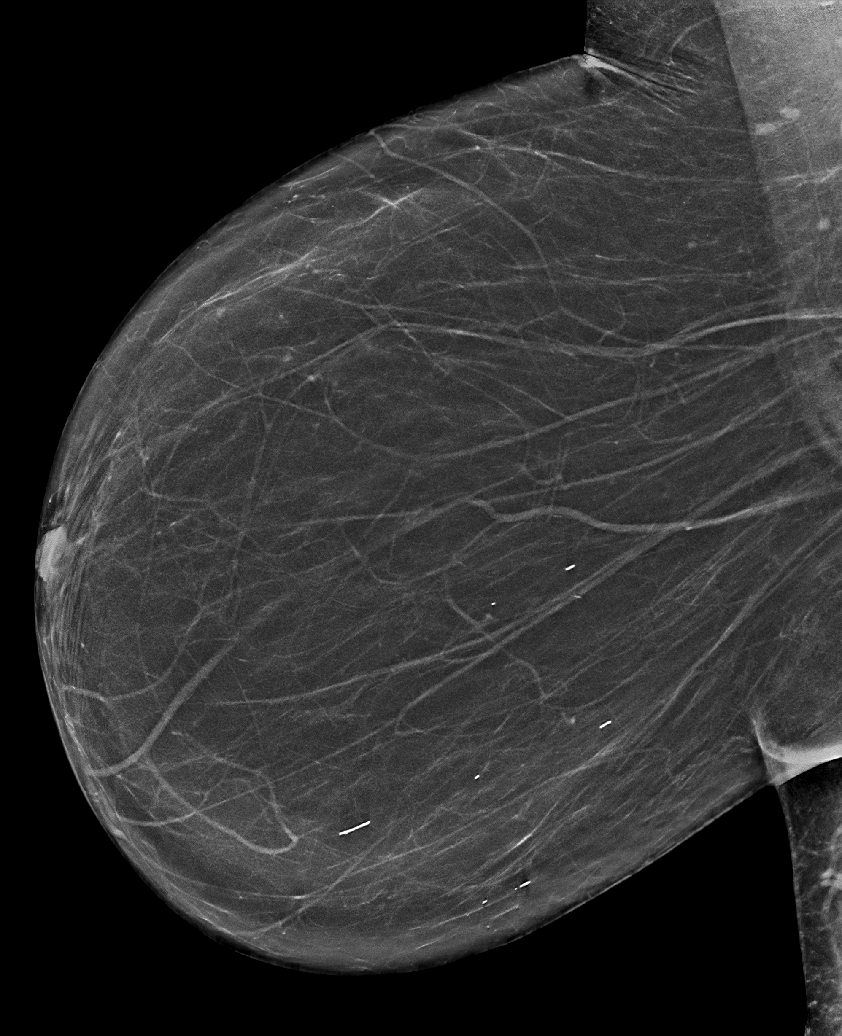

[L CC tomo · tomo slice 31/62.0]
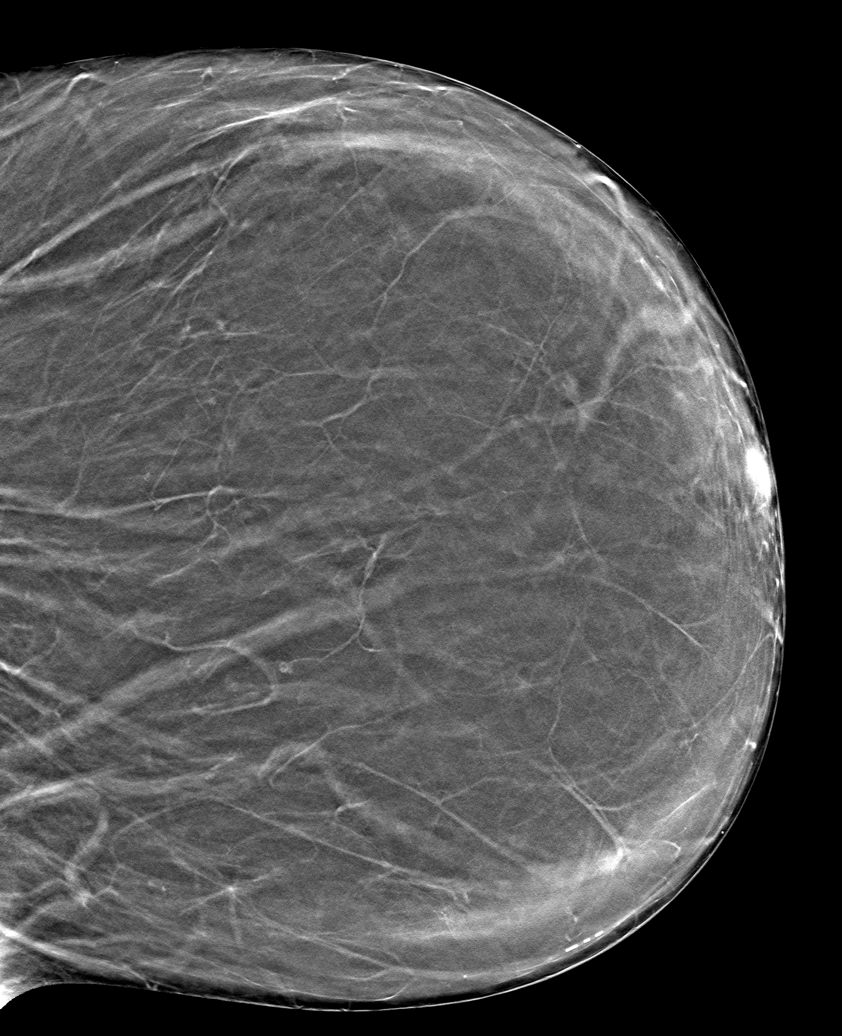

[6 of 30 positions shown; findings below may reference images not displayed]

FINDINGS: There are no findings suspicious for malignancy.
IMPRESSION: No mammographic evidence of malignancy. A result letter of this
screening mammogram will be mailed directly to the patient.

RECOMMENDATION:
Screening mammogram in one year. (Code:0E-3-N98)

BI-RADS CATEGORY  1: Negative.

## 2023-01-26 ENCOUNTER — Other Ambulatory Visit: Payer: Self-pay | Admitting: Internal Medicine

## 2023-01-26 DIAGNOSIS — Z1231 Encounter for screening mammogram for malignant neoplasm of breast: Secondary | ICD-10-CM

## 2023-03-12 ENCOUNTER — Ambulatory Visit
Admission: RE | Admit: 2023-03-12 | Discharge: 2023-03-12 | Disposition: A | Discharge status: Home / Self Care | Payer: BC Managed Care – PPO | Source: Ambulatory Visit | Attending: Internal Medicine | Admitting: Internal Medicine

## 2023-03-12 DIAGNOSIS — Z1231 Encounter for screening mammogram for malignant neoplasm of breast: Secondary | ICD-10-CM | POA: Diagnosis present

## 2023-05-14 ENCOUNTER — Other Ambulatory Visit: Payer: Self-pay | Admitting: Nurse Practitioner

## 2023-05-14 DIAGNOSIS — E669 Obesity, unspecified: Secondary | ICD-10-CM

## 2023-05-14 DIAGNOSIS — R079 Chest pain, unspecified: Secondary | ICD-10-CM

## 2023-05-14 DIAGNOSIS — G4733 Obstructive sleep apnea (adult) (pediatric): Secondary | ICD-10-CM

## 2023-05-14 DIAGNOSIS — E785 Hyperlipidemia, unspecified: Secondary | ICD-10-CM

## 2023-05-14 DIAGNOSIS — R0609 Other forms of dyspnea: Secondary | ICD-10-CM

## 2023-05-14 DIAGNOSIS — I350 Nonrheumatic aortic (valve) stenosis: Secondary | ICD-10-CM

## 2023-05-14 DIAGNOSIS — Z87891 Personal history of nicotine dependence: Secondary | ICD-10-CM

## 2023-05-15 ENCOUNTER — Other Ambulatory Visit
Admission: RE | Admit: 2023-05-15 | Discharge: 2023-05-15 | Disposition: A | Discharge status: Home / Self Care | Payer: Self-pay | Source: Ambulatory Visit | Attending: Nurse Practitioner | Admitting: Nurse Practitioner

## 2023-05-15 DIAGNOSIS — R0609 Other forms of dyspnea: Secondary | ICD-10-CM | POA: Insufficient documentation

## 2023-05-15 LAB — BRAIN NATRIURETIC PEPTIDE: B Natriuretic Peptide: 269.4 pg/mL — ABNORMAL HIGH (ref 0.0–100.0)

## 2023-05-26 ENCOUNTER — Ambulatory Visit
Admission: RE | Admit: 2023-05-26 | Discharge: 2023-05-26 | Disposition: A | Discharge status: Home / Self Care | Payer: Self-pay | Source: Ambulatory Visit | Attending: Nurse Practitioner | Admitting: Nurse Practitioner

## 2023-05-26 DIAGNOSIS — R0609 Other forms of dyspnea: Secondary | ICD-10-CM | POA: Insufficient documentation

## 2023-05-26 DIAGNOSIS — Z87891 Personal history of nicotine dependence: Secondary | ICD-10-CM | POA: Insufficient documentation

## 2023-05-26 DIAGNOSIS — E785 Hyperlipidemia, unspecified: Secondary | ICD-10-CM | POA: Insufficient documentation

## 2023-05-26 DIAGNOSIS — E669 Obesity, unspecified: Secondary | ICD-10-CM | POA: Insufficient documentation

## 2023-05-26 DIAGNOSIS — R079 Chest pain, unspecified: Secondary | ICD-10-CM | POA: Insufficient documentation

## 2023-05-26 DIAGNOSIS — I350 Nonrheumatic aortic (valve) stenosis: Secondary | ICD-10-CM | POA: Insufficient documentation

## 2023-05-26 DIAGNOSIS — G4733 Obstructive sleep apnea (adult) (pediatric): Secondary | ICD-10-CM | POA: Insufficient documentation

## 2023-07-02 DIAGNOSIS — R0602 Shortness of breath: Secondary | ICD-10-CM | POA: Diagnosis not present

## 2023-07-02 DIAGNOSIS — G4733 Obstructive sleep apnea (adult) (pediatric): Secondary | ICD-10-CM | POA: Diagnosis not present

## 2023-07-02 DIAGNOSIS — R7309 Other abnormal glucose: Secondary | ICD-10-CM | POA: Diagnosis not present

## 2023-07-02 DIAGNOSIS — E785 Hyperlipidemia, unspecified: Secondary | ICD-10-CM | POA: Diagnosis not present

## 2023-07-02 DIAGNOSIS — J449 Chronic obstructive pulmonary disease, unspecified: Secondary | ICD-10-CM | POA: Diagnosis not present

## 2023-07-06 DIAGNOSIS — R7309 Other abnormal glucose: Secondary | ICD-10-CM | POA: Diagnosis not present

## 2023-07-06 DIAGNOSIS — E785 Hyperlipidemia, unspecified: Secondary | ICD-10-CM | POA: Diagnosis not present

## 2023-08-31 DIAGNOSIS — I2510 Atherosclerotic heart disease of native coronary artery without angina pectoris: Secondary | ICD-10-CM | POA: Diagnosis not present

## 2023-08-31 DIAGNOSIS — I350 Nonrheumatic aortic (valve) stenosis: Secondary | ICD-10-CM | POA: Diagnosis not present

## 2023-08-31 DIAGNOSIS — G4733 Obstructive sleep apnea (adult) (pediatric): Secondary | ICD-10-CM | POA: Diagnosis not present

## 2023-08-31 DIAGNOSIS — R011 Cardiac murmur, unspecified: Secondary | ICD-10-CM | POA: Diagnosis not present

## 2023-08-31 DIAGNOSIS — E669 Obesity, unspecified: Secondary | ICD-10-CM | POA: Diagnosis not present

## 2023-08-31 DIAGNOSIS — R0602 Shortness of breath: Secondary | ICD-10-CM | POA: Diagnosis not present

## 2023-08-31 DIAGNOSIS — J449 Chronic obstructive pulmonary disease, unspecified: Secondary | ICD-10-CM | POA: Diagnosis not present

## 2023-10-01 DIAGNOSIS — I350 Nonrheumatic aortic (valve) stenosis: Secondary | ICD-10-CM | POA: Diagnosis not present

## 2023-10-01 DIAGNOSIS — J449 Chronic obstructive pulmonary disease, unspecified: Secondary | ICD-10-CM | POA: Diagnosis not present

## 2023-10-01 DIAGNOSIS — E669 Obesity, unspecified: Secondary | ICD-10-CM | POA: Diagnosis not present

## 2023-10-01 DIAGNOSIS — R0602 Shortness of breath: Secondary | ICD-10-CM | POA: Diagnosis not present

## 2023-10-09 ENCOUNTER — Encounter: Payer: Self-pay | Admitting: Internal Medicine

## 2023-10-27 ENCOUNTER — Ambulatory Visit: Admitting: Anesthesiology

## 2023-10-27 ENCOUNTER — Encounter: Payer: Self-pay | Admitting: Internal Medicine

## 2023-10-27 ENCOUNTER — Encounter
Admission: RE | Disposition: A | Discharge status: Home / Self Care | Payer: Self-pay | Source: Home / Self Care | Attending: Internal Medicine

## 2023-10-27 ENCOUNTER — Ambulatory Visit
Admission: RE | Admit: 2023-10-27 | Discharge: 2023-10-27 | Disposition: A | Discharge status: Home / Self Care | Attending: Internal Medicine | Admitting: Internal Medicine

## 2023-10-27 DIAGNOSIS — K640 First degree hemorrhoids: Secondary | ICD-10-CM | POA: Diagnosis not present

## 2023-10-27 DIAGNOSIS — F419 Anxiety disorder, unspecified: Secondary | ICD-10-CM | POA: Insufficient documentation

## 2023-10-27 DIAGNOSIS — K5730 Diverticulosis of large intestine without perforation or abscess without bleeding: Secondary | ICD-10-CM | POA: Insufficient documentation

## 2023-10-27 DIAGNOSIS — I350 Nonrheumatic aortic (valve) stenosis: Secondary | ICD-10-CM | POA: Insufficient documentation

## 2023-10-27 DIAGNOSIS — K5790 Diverticulosis of intestine, part unspecified, without perforation or abscess without bleeding: Secondary | ICD-10-CM | POA: Diagnosis not present

## 2023-10-27 DIAGNOSIS — K921 Melena: Secondary | ICD-10-CM | POA: Diagnosis not present

## 2023-10-27 DIAGNOSIS — Z1211 Encounter for screening for malignant neoplasm of colon: Secondary | ICD-10-CM | POA: Diagnosis not present

## 2023-10-27 DIAGNOSIS — K2970 Gastritis, unspecified, without bleeding: Secondary | ICD-10-CM | POA: Diagnosis not present

## 2023-10-27 DIAGNOSIS — K621 Rectal polyp: Secondary | ICD-10-CM | POA: Insufficient documentation

## 2023-10-27 DIAGNOSIS — D509 Iron deficiency anemia, unspecified: Secondary | ICD-10-CM | POA: Insufficient documentation

## 2023-10-27 DIAGNOSIS — K635 Polyp of colon: Secondary | ICD-10-CM | POA: Diagnosis not present

## 2023-10-27 DIAGNOSIS — E785 Hyperlipidemia, unspecified: Secondary | ICD-10-CM | POA: Insufficient documentation

## 2023-10-27 DIAGNOSIS — J4489 Other specified chronic obstructive pulmonary disease: Secondary | ICD-10-CM | POA: Diagnosis not present

## 2023-10-27 DIAGNOSIS — K648 Other hemorrhoids: Secondary | ICD-10-CM | POA: Diagnosis not present

## 2023-10-27 DIAGNOSIS — K449 Diaphragmatic hernia without obstruction or gangrene: Secondary | ICD-10-CM | POA: Insufficient documentation

## 2023-10-27 DIAGNOSIS — Z7982 Long term (current) use of aspirin: Secondary | ICD-10-CM | POA: Diagnosis not present

## 2023-10-27 DIAGNOSIS — K2270 Barrett's esophagus without dysplasia: Secondary | ICD-10-CM | POA: Insufficient documentation

## 2023-10-27 HISTORY — PX: ESOPHAGOGASTRODUODENOSCOPY: SHX5428

## 2023-10-27 HISTORY — DX: Cardiac murmur, unspecified: R01.1

## 2023-10-27 HISTORY — PX: POLYPECTOMY: SHX149

## 2023-10-27 HISTORY — DX: Nonrheumatic aortic (valve) stenosis: I35.0

## 2023-10-27 HISTORY — PX: COLONOSCOPY: SHX5424

## 2023-10-27 SURGERY — COLONOSCOPY
Anesthesia: General

## 2023-10-27 MED ORDER — PROPOFOL 10 MG/ML IV BOLUS
INTRAVENOUS | Status: DC | PRN
  Administered 2023-10-27 (×4): 20 mg via INTRAVENOUS
  Administered 2023-10-27: 40 mg via INTRAVENOUS
  Administered 2023-10-27 (×4): 20 mg via INTRAVENOUS
  Administered 2023-10-27: 40 mg via INTRAVENOUS

## 2023-10-27 MED ORDER — FENTANYL CITRATE (PF) 100 MCG/2ML IJ SOLN
INTRAMUSCULAR | Status: AC
  Filled 2023-10-27: qty 2

## 2023-10-27 MED ORDER — LIDOCAINE HCL (PF) 2 % IJ SOLN
INTRAMUSCULAR | Status: DC | PRN
  Administered 2023-10-27: 40 mg via INTRADERMAL

## 2023-10-27 MED ORDER — EPHEDRINE SULFATE (PRESSORS) 50 MG/ML IJ SOLN
INTRAMUSCULAR | Status: DC | PRN
Start: 2023-10-27 — End: 2023-10-27
  Administered 2023-10-27: 5 mg via INTRAVENOUS

## 2023-10-27 MED ORDER — SODIUM CHLORIDE 0.9 % IV SOLN: INTRAVENOUS | Status: DC

## 2023-10-27 MED ORDER — PHENYLEPHRINE HCL (PRESSORS) 10 MG/ML IV SOLN
INTRAVENOUS | Status: DC | PRN
Start: 2023-10-27 — End: 2023-10-27
  Administered 2023-10-27: 100 ug via INTRAVENOUS

## 2023-10-27 MED ORDER — FENTANYL CITRATE (PF) 100 MCG/2ML IJ SOLN
INTRAMUSCULAR | Status: DC | PRN
  Administered 2023-10-27: 50 ug via INTRAVENOUS

## 2023-10-27 NOTE — H&P (Signed)
 Outpatient short stay form Pre-procedure 10/27/2023 10:35 AM Yvette Jackson, M.D.  Primary Physician: Yvette Jackson, M.D.  Reason for visit:    Diagnoses  Hematochezia  Other iron deficiency anemia  Hx of adenomatous colonic polyps  Barrett's esophagus without dysplasia     History of present illness:  73 y/o AA female with a PMH of HLD, COPD, asthma, moderate aortic stenosis, CAD, OSA not on CPAP, and anxiety presents to the [facility] for chief complaint of painless hematochezia and unexplained IDA Yvette Jackson presents to the [facility] at the request of Yvette Maiden, NP, for chief complaint of rectal bleeding and iron-deficiency anemia. Referral was initially placed back in Feb 2025 after visit with Cardiology team after she was reporting painless hematochezia. She has had chronic anemia dating back 6+ years on labs. Labs in February 2025 showed hemoglobin 9.6, MCV 88, ferritin 6, serum iron 24, TIBC 387, and iron sat 5%. She was advised to start oral iron supplement and re-establish care with GI. She had her aspirin held since this time. Her diagnosis of IDA is not new. She was seen back in the clinic by myself in 2019 for unexplained IDA where she had bidirectional endoscopies which showed no sources of anemia or bleeding. She did have finding of Barrett's esophagus negative for dysplasia, mild chronic gastritis, and numerous polyps removed from her colon. She has continued to see intermittent hematochezia. She sees blood primarily on tissue paper after wiping and sometimes drops in the bowl. She last saw the bleeding 3 days ago. When her bleeding was really bad back in February she saw quite a bit of blood in the bowl. She was seen at the [facility] ED back in Dec 2024 where she had hemoglobin 10.3 and CT abd/pelvis with IV contrast commented on diffuse colonic diverticulosis without obvious evidence of acute diverticulitis. Etiology of rectal bleeding could not be  identified. A colonoscopy was recommended, but not performed. She is wanting to get a colonoscopy scheduled. She denies any abdominal pain or abdominal cramping. Her bowels are moving about once a day on average. She denies any melanotic stool. She denies any unintentional weight loss or loss of appetite. She denies any nausea, vomiting, esophageal dysphagia, odynophagia, early satiety, hoarseness, or epigastric abdominal pain. She is compliant with once daily PPI and this is working well to manage GERD symptoms.      Current Facility-Administered Medications:    0.9 %  sodium chloride  infusion, , Intravenous, Continuous, Yvette Jackson K, MD, Last Rate: 20 mL/hr at 10/27/23 1031, Continued from Pre-op at 10/27/23 1031  Medications Prior to Admission  Medication Sig Dispense Refill Last Dose/Taking   albuterol  (PROVENTIL  HFA;VENTOLIN  HFA) 108 (90 Base) MCG/ACT inhaler Inhale 2 puffs into the lungs every 6 (six) hours as needed for wheezing or shortness of breath.    10/26/2023 at 10:00 AM   aspirin EC 81 MG tablet Take 81 mg by mouth daily. As needed   10/20/2023   budesonide -glycopyrrolate -formoterol (BREZTRI AEROSPHERE) 160-9-4.8 MCG/ACT AERO inhaler Inhale 2 puffs into the lungs 2 (two) times daily.   Past Week   busPIRone (BUSPAR) 7.5 MG tablet Take 7.5 mg by mouth 3 (three) times daily.   10/27/2023 at  7:00 AM   ferrous sulfate 325 (65 FE) MG EC tablet Take 325 mg by mouth 3 (three) times daily with meals.   Taking   furosemide  (LASIX ) 20 MG tablet Take 20 mg by mouth.   Taking   metoprolol  succinate (TOPROL-XL) 25 MG 24 hr tablet Take 25 mg by mouth daily.   Taking   PARoxetine (PAXIL) 10 MG tablet Take 20 mg by mouth daily.   Taking   budesonide -formoterol (SYMBICORT) 160-4.5 MCG/ACT inhaler Inhale 2 puffs into the lungs daily.  (Patient not taking: Reported on 10/27/2023)   Not Taking   Estradiol  10 MCG TABS vaginal tablet Place 1 tablet (10 mcg total) vaginally 2 (two) times a week. Use nightly  for 2 weeks, then decrease to twice a week. (Patient not taking: Reported on 10/27/2023) 30 tablet 3 Not Taking   fexofenadine (ALLEGRA) 180 MG tablet Take 180 mg by mouth daily as needed for allergies or rhinitis.      omeprazole  (PRILOSEC  OTC) 20 MG tablet Take 1 tablet (20 mg total) by mouth daily. 90 tablet 3    rosuvastatin (CRESTOR) 10 MG tablet Take 10 mg by mouth daily.        Allergies  Allergen Reactions   Acetaminophen  Hives    Pt takes Advil at home for pain   Sulfasalazine Swelling    Per pt takes Advil at home.      Past Medical History:  Diagnosis Date   Anxiety    Bronchitis    COPD (chronic obstructive pulmonary disease) (HCC)    Difficult intubation    Dyspnea    with exertion   Family history of adverse reaction to anesthesia    Sister died from anesthesia many years ago during thyroid surgery   Heart murmur    History of hiatal hernia    Motion sickness    cars   Nonrheumatic aortic (valve) stenosis    Panic attacks     Review of systems:  Otherwise negative.    Physical Exam  Gen: Alert, oriented. Appears stated age.  HEENT: [place]/AT. PERRLA. Lungs: CTA, no wheezes. CV: RR nl S1, S2. Abd: soft, benign, no masses. BS+ Ext: No edema. Pulses 2+    Planned procedures: Proceed with EGD and colonoscopy. The patient understands the nature of the planned procedure, indications, risks, alternatives and potential complications including but not limited to bleeding, infection, perforation, damage to internal organs and possible oversedation/side effects from anesthesia. The patient agrees and gives consent to proceed.  Please refer to procedure notes for findings, recommendations and patient disposition/instructions.     Yvette Jackson, M.D. Gastroenterology 10/27/2023  10:35 AM

## 2023-10-27 NOTE — Transfer of Care (Signed)
 Immediate Anesthesia Transfer of Care Note  Patient: Yvette Jackson  Procedure(s) Performed: COLONOSCOPY EGD (ESOPHAGOGASTRODUODENOSCOPY) POLYPECTOMY, INTESTINE  Patient Location: PACU and Endoscopy Unit  Anesthesia Type:MAC  Level of Consciousness: awake  Airway & Oxygen Therapy: Patient Spontanous Breathing and Patient connected to nasal cannula oxygen  Post-op Assessment: Report given to RN and Post -op Vital signs reviewed and stable  Post vital signs: Reviewed and stable  Last Vitals:  Vitals Value Taken Time  BP 111/84 10/27/23 11:23  Temp 36.1 C 10/27/23 11:22  Pulse 82 10/27/23 11:24  Resp 18 10/27/23 11:24  SpO2 100 % 10/27/23 11:24  Vitals shown include unfiled device data.  Last Pain:  Vitals:   10/27/23 1122  TempSrc: Temporal         Complications: No notable events documented.

## 2023-10-27 NOTE — Anesthesia Postprocedure Evaluation (Signed)
 Anesthesia Post Note  Patient: Yvette Jackson  Procedure(s) Performed: COLONOSCOPY EGD (ESOPHAGOGASTRODUODENOSCOPY) POLYPECTOMY, INTESTINE  Patient location during evaluation: PACU Anesthesia Type: General Level of consciousness: awake and alert, oriented and patient cooperative Pain management: pain level controlled Vital Signs Assessment: post-procedure vital signs reviewed and stable Respiratory status: spontaneous breathing, nonlabored ventilation and respiratory function stable Cardiovascular status: blood pressure returned to baseline and stable Postop Assessment: adequate PO intake Anesthetic complications: no   No notable events documented.   Last Vitals:  Vitals:   10/27/23 1132 10/27/23 1142  BP: (!) 144/84 (!) 157/80  Pulse: 73 74  Resp: 16 15  Temp:    SpO2: 99% 96%    Last Pain:  Vitals:   10/27/23 1122  TempSrc: Temporal                 Lanna Labella

## 2023-10-27 NOTE — Anesthesia Preprocedure Evaluation (Addendum)
 Anesthesia Evaluation  Patient identified by MRN, date of birth, ID band Patient awake    Reviewed: Allergy & Precautions, NPO status , Patient's Chart, lab work & pertinent test results  History of Anesthesia Complications (+) DIFFICULT AIRWAY and history of anesthetic complications  Airway Mallampati: IV   Neck ROM: Full    Dental  (+) Upper Dentures   Pulmonary COPD, former smoker (quit 2018)   Pulmonary exam normal breath sounds clear to auscultation       Cardiovascular + Valvular Problems/Murmurs (moderate AS)  Rhythm:Regular Rate:Normal + Systolic murmurs ECG 06/01/23:  Normal sinus rhythm  Possible Left atrial enlargement  Nonspecific ST and T wave abnormality  When compared with ECG of 13-Sep-2019 10:06,  No significant change was found   Echo 10/07/19:  NORMAL LEFT VENTRICULAR SYSTOLIC FUNCTION   WITH MILD LVH  NORMAL RIGHT VENTRICULAR SYSTOLIC FUNCTION  MILD VALVULAR REGURGITATION  MODERATE VALVULAR STENOSIS   AVA(VTI)=1.01cm^2  MODERATE AS  MILD AR, MR, TR, PR  EF 60%  Closest EF: >55% (Estimated)  LVH: MILD LVH  AVS: MODERATE AS  Aortic: MILD AR  Mitral: MILD MR  Tricuspid: MILD TR      Neuro/Psych  PSYCHIATRIC DISORDERS (panic disorder) Anxiety     negative neurological ROS     GI/Hepatic hiatal hernia,,,  Endo/Other  Obesity   Renal/GU negative Renal ROS     Musculoskeletal   Abdominal   Peds  Hematology negative hematology ROS (+)   Anesthesia Other Findings Cardiology note 08/31/23:  73 y.o. female with  Encounter Diagnoses  Name Primary?  Heart murmur, systolic  Nonrheumatic aortic valve stenosis Yes  SOB (shortness of breath)  Obesity (BMI 35.0-39.9 without comorbidity), unspecified  Chronic obstructive pulmonary disease, unspecified COPD type (CMS/HHS-HCC)  Coronary artery disease involving native coronary artery of native heart without angina pectoris  OSA (obstructive sleep  apnea)   Plan   73 yo female with stable chronic DOE. Recent 2D ECHO with normal LV systolic function, EF > 55%. Stable moderate AS with moderate AR, PR, TR. CT CAC score 05/26/23 18.3 in LAD only, 49th percentile for age. Labs notable for worsening anemia with recent h/o melena and chronic history of IDA, not currently on iron supplementation. She denies current episodes of melena. Recommended to start ferrous sulfate 325 mg qd and referred to GI. Pt not currently supplementing. She has COPD, is not taking Breztri daily, and untreated OSA due to financial barriers. S/S have improved with furosemide . Euvolemic on exam.   - For insignificant CAD management - Hold aspirin until evaluation by GI 09/22/23. Stop NSAIDs. Start ferrous sulfate 325 mg qd. Continue rosuvastatin 10 mg qd for HLD and ASCVD prevention. LDL 58 mg/dL 7/89/74, at goal.  - Continue furosemide  20 mg qd, metoprolol XL 25 mg qd. BP at goal. Improvement in DOE. Stable BMP 06/01/23. - Encouraged low salt diet.  - Moderate AS with moderate AR. Surveillance ECHO in 1 year, sooner if symptoms dictate. Pt with improving dyspnea and energy level with starting furosemide . Euvolemic on exam. Without dizziness or syncope.  - Continue following with Pulmonology. Encouraged Breztri compliance.   Return in about 6 months (around 03/02/2024).    Reproductive/Obstetrics                              Anesthesia Physical Anesthesia Plan  ASA: 3  Anesthesia Plan: General   Post-op Pain Management:    Induction: Intravenous  PONV Risk Score and Plan: 3 and Propofol  infusion, TIVA and Treatment may vary due to age or medical condition  Airway Management Planned: Natural Airway  Additional Equipment:   Intra-op Plan:   Post-operative Plan:   Informed Consent: I have reviewed the patients History and Physical, chart, labs and discussed the procedure including the risks, benefits and alternatives for the proposed  anesthesia with the patient or authorized representative who has indicated his/her understanding and acceptance.       Plan Discussed with: CRNA  Anesthesia Plan Comments: (LMA/GETA backup discussed.  Patient consented for risks of anesthesia including but not limited to:  - adverse reactions to medications - damage to eyes, teeth, lips or other oral mucosa - nerve damage due to positioning  - sore throat or hoarseness - damage to heart, brain, nerves, lungs, other parts of body or loss of life  Informed patient about role of CRNA in peri- and intra-operative care.  Patient voiced understanding.)         Anesthesia Quick Evaluation

## 2023-10-27 NOTE — Op Note (Signed)
 [facility] Gastroenterology Patient Name: Martin Smeal Procedure Date: 10/27/2023 10:40 AM MRN: [id number] Account #: [id number] Date of Birth: [birth year] Admit Type: Outpatient Age: 73 Room: [facility] ENDO ROOM 1 Gender: Female Note Status: Finalized Instrument Name: Upper Endoscope [id number] Procedure:             Upper GI endoscopy Indications:           Surveillance for malignancy due to personal history of                         Barrett's esophagus, Unexplained iron deficiency anemia Providers:             Nyshaun Standage K. Aundria MD, MD Referring MD:          Honor BROCKS. Corlis, MD (Referring MD) Medicines:             Propofol  per Anesthesia Complications:         No immediate complications. Estimated blood loss:                         Minimal. Procedure:             Pre-Anesthesia Assessment:                        - The risks and benefits of the procedure and the                         sedation options and risks were discussed with the                         patient. All questions were answered and informed                         consent was obtained.                        - Patient identification and proposed procedure were                         verified prior to the procedure by the nurse. The                         procedure was verified in the procedure room.                        - ASA Grade Assessment: III - A patient with severe                         systemic disease.                        - After reviewing the risks and benefits, the patient                         was deemed in satisfactory condition to undergo the                         procedure.  After obtaining informed consent, the endoscope was                         passed under direct vision. Throughout the procedure,                         the patient's blood pressure, pulse, and oxygen                         saturations were monitored continuously. The Endoscope                          was introduced through the mouth, and advanced to the                         third part of duodenum. The upper GI endoscopy was                         accomplished without difficulty. The patient tolerated                         the procedure well. Findings:      There were esophageal mucosal changes secondary to established       long-segment Barrett's disease present in the lower third of the       esophagus. The maximum longitudinal extent of these mucosal changes was       4 cm in length. Mucosa was biopsied with a cold forceps for histology in       a targeted manner at intervals of 2 cm in the lower third of the       esophagus. A total of 2 specimen bottles were sent to pathology.       Estimated blood loss was minimal.      Patchy minimal inflammation characterized by erythema was found in the       gastric antrum.      A large hiatal hernia was present.      The exam of the stomach was otherwise normal.      The examined duodenum was normal. Impression:            - Esophageal mucosal changes secondary to established                         long-segment Barrett's disease. Biopsied.                        - Gastritis.                        - Large hiatal hernia.                        - Normal examined duodenum. Recommendation:        - Await pathology results.                        - Proceed with colonoscopy Procedure Code(s):     --- Professional ---                        [phone], Esophagogastroduodenoscopy, flexible,  transoral; with biopsy, single or multiple Diagnosis Code(s):     --- Professional ---                        D50.9, Iron deficiency anemia, unspecified                        K44.9, Diaphragmatic hernia without obstruction or                         gangrene                        K29.70, Gastritis, unspecified, without bleeding                        K22.70, Barrett's esophagus without dysplasia CPT copyright  2022 American Medical Association. All rights reserved. The codes documented in this report are preliminary and upon coder review may  be revised to meet current compliance requirements. Ladell MARLA Boss MD, MD 10/27/2023 11:05:29 AM This report has been signed electronically. Number of Addenda: 0 Note Initiated On: 10/27/2023 10:40 AM Estimated Blood Loss:  Estimated blood loss was minimal.      [facility]

## 2023-10-27 NOTE — Interval H&P Note (Signed)
 History and Physical Interval Note:  10/27/2023 10:37 AM  Yvette Jackson  has presented today for surgery, with the diagnosis of Hematochezia (K92.1) Other iron deficiency anemia (D50.8) Hx of adenomatous colonic polyps (Z86.0101) Barrett's esophagus without dysplasia (K22.70).  The various methods of treatment have been discussed with the patient and family. After consideration of risks, benefits and other options for treatment, the patient has consented to  Procedure(s): COLONOSCOPY (N/A) EGD (ESOPHAGOGASTRODUODENOSCOPY) (N/A) as a surgical intervention.  The patient's history has been reviewed, patient examined, no change in status, stable for surgery.  I have reviewed the patient's chart and labs.  Questions were answered to the patient's satisfaction.     [place], [place]

## 2023-10-27 NOTE — Op Note (Signed)
 [facility] Patient Name: Yvette Jackson Procedure Date: 10/27/2023 10:39 AM MRN: [id number] Account #: [id number] Date of Birth: January 11, 1951 Admit Type: Outpatient Age: 73 Room: [facility] ENDO ROOM 1 Gender: Female Note Status: Supervisor Override Instrument Name: Arvis [id number] Procedure:             Colonoscopy Indications:           High risk colon cancer surveillance: Personal history                         of colonic polyps, Unexplained iron deficiency anemia Providers:             Jayne Peckenpaugh K. Aundria MD, MD Referring MD:          Honor BROCKS. Corlis, MD (Referring MD) Medicines:             Propofol  per Anesthesia Complications:         No immediate complications. Estimated blood loss:                         Minimal. Procedure:             Pre-Anesthesia Assessment:                        - The risks and benefits of the procedure and the                         sedation options and risks were discussed with the                         patient. All questions were answered and informed                         consent was obtained.                        - Patient identification and proposed procedure were                         verified prior to the procedure by the nurse. The                         procedure was verified in the procedure room.                        - ASA Grade Assessment: III - A patient with severe                         systemic disease.                        - After reviewing the risks and benefits, the patient                         was deemed in satisfactory condition to undergo the                         procedure.  After obtaining informed consent, the colonoscope was                         passed under direct vision. Throughout the procedure,                         the patient's blood pressure, pulse, and oxygen                         saturations were monitored continuously. The                          Colonoscope was introduced through the anus and                         advanced to the the cecum, identified by appendiceal                         orifice and ileocecal valve. The colonoscopy was                         performed without difficulty. The patient tolerated                         the procedure well. The quality of the bowel                         preparation was fair. The ileocecal valve, appendiceal                         orifice, and rectum were photographed. Findings:      The perianal and digital rectal examinations were normal. Pertinent       negatives include normal sphincter tone and no palpable rectal lesions.      Non-bleeding internal hemorrhoids were found during retroflexion. The       hemorrhoids were Grade I (internal hemorrhoids that do not prolapse).      Many small-mouthed diverticula were found in the sigmoid colon.      Four sessile polyps were found in the rectum. The polyps were 3 to 5 mm       in size. These polyps were removed with a jumbo cold forceps. Resection       and retrieval were complete. Estimated blood loss was minimal.      A moderate amount of liquid stool was found in the entire colon, making       visualization difficult. Lavage of the area was performed using a       moderate amount of sterile water , resulting in clearance with fair       visualization. Estimated blood loss: none.      The exam was otherwise without abnormality. Impression:            - Preparation of the colon was fair.                        - Non-bleeding internal hemorrhoids.                        - Diverticulosis in the sigmoid colon.                        -  Four 3 to 5 mm polyps in the rectum, removed with a                         jumbo cold forceps. Resected and retrieved.                        - Stool in the entire examined colon.                        - The examination was otherwise normal. Recommendation:        - Await pathology results  from EGD, also performed                         today.                        - Repeat EGD for Barrett's for surveillance, interval                         to be decided after pathology review.                        - Patient has a contact number available for                         emergencies. The signs and symptoms of potential                         delayed complications were discussed with the patient.                         Return to normal activities tomorrow. Written                         discharge instructions were provided to the patient.                        - Resume previous diet.                        - Continue present medications.                        - Await pathology results.                        - Repeat colonoscopy in 3 - 5 years for surveillance.                        - Follow up with [name], PA-C in the GI office.                         [phone]                        - Telephone GI office to schedule appointment.                        - I will advise wireless capsule endoscopy of the  small intestine. for further evaluation of iron                         deficiency anemia.                        - The findings and recommendations were discussed with                         the patient. Procedure Code(s):     --- Professional ---                        [phone], Colonoscopy, flexible; with biopsy, single or                         multiple Diagnosis Code(s):     --- Professional ---                        K57.30, Diverticulosis of large intestine without                         perforation or abscess without bleeding                        D50.9, Iron deficiency anemia, unspecified                        D12.8, Benign neoplasm of rectum                        K64.0, First degree hemorrhoids CPT copyright 2022 American Medical Association. All rights reserved. The codes documented in this report are preliminary and  upon coder review may  be revised to meet current compliance requirements. Ladell MARLA Boss MD, MD 10/27/2023 11:23:05 AM This report has been signed electronically. Number of Addenda: 0 Note Initiated On: 10/27/2023 10:39 AM Scope Withdrawal Time: 0 hours 6 minutes 31 seconds  Total Procedure Duration: 0 hours 10 minutes 47 seconds  Estimated Blood Loss:  Estimated blood loss was minimal.      [facility]

## 2023-10-28 ENCOUNTER — Encounter: Payer: Self-pay | Admitting: Internal Medicine

## 2023-10-28 LAB — SURGICAL PATHOLOGY

## 2023-12-23 ENCOUNTER — Ambulatory Visit: Admitting: General Practice

## 2023-12-23 ENCOUNTER — Encounter: Payer: Self-pay | Admitting: General Practice

## 2023-12-23 VITALS — BP 126/68 | HR 63 | Temp 98.3°F | Ht 61.5 in | Wt 198.5 lb

## 2023-12-23 DIAGNOSIS — J45909 Unspecified asthma, uncomplicated: Secondary | ICD-10-CM | POA: Diagnosis not present

## 2023-12-23 DIAGNOSIS — I2510 Atherosclerotic heart disease of native coronary artery without angina pectoris: Secondary | ICD-10-CM

## 2023-12-23 DIAGNOSIS — J42 Unspecified chronic bronchitis: Secondary | ICD-10-CM | POA: Diagnosis not present

## 2023-12-23 DIAGNOSIS — Z780 Asymptomatic menopausal state: Secondary | ICD-10-CM

## 2023-12-23 DIAGNOSIS — E785 Hyperlipidemia, unspecified: Secondary | ICD-10-CM

## 2023-12-23 DIAGNOSIS — I350 Nonrheumatic aortic (valve) stenosis: Secondary | ICD-10-CM | POA: Diagnosis not present

## 2023-12-23 DIAGNOSIS — K219 Gastro-esophageal reflux disease without esophagitis: Secondary | ICD-10-CM

## 2023-12-23 DIAGNOSIS — Z23 Encounter for immunization: Secondary | ICD-10-CM

## 2023-12-23 DIAGNOSIS — Z1231 Encounter for screening mammogram for malignant neoplasm of breast: Secondary | ICD-10-CM

## 2023-12-23 DIAGNOSIS — G4733 Obstructive sleep apnea (adult) (pediatric): Secondary | ICD-10-CM | POA: Diagnosis not present

## 2023-12-23 DIAGNOSIS — E66812 Obesity, class 2: Secondary | ICD-10-CM

## 2023-12-23 DIAGNOSIS — J449 Chronic obstructive pulmonary disease, unspecified: Secondary | ICD-10-CM

## 2023-12-23 DIAGNOSIS — Z7689 Persons encountering health services in other specified circumstances: Secondary | ICD-10-CM | POA: Insufficient documentation

## 2023-12-23 DIAGNOSIS — R011 Cardiac murmur, unspecified: Secondary | ICD-10-CM

## 2023-12-23 NOTE — Assessment & Plan Note (Signed)
 EMR reviewed briefly.

## 2023-12-23 NOTE — Progress Notes (Signed)
 New Patient Office Visit  Subjective    Patient ID: Yvette Jackson, female    DOB: Mar 06, 1951  Age: 73 y.o. MRN: [id number]  CC:  Chief Complaint  Patient presents with   New Patient (Initial Visit)    HPI Yvette Jackson is a 73 y.o. female presents to establish care. Previous PCP/physical/labs: Honor Cork, MD  Discussed the use of AI scribe software for clinical note transcription with the patient, who gave verbal consent to proceed.  History of Present Illness Yvette Jackson is a 73 year old female with COPD and anxiety who presents to establish care.  Yvette Jackson has a history of COPD and is following with pulmonology. Yvette Jackson quit smoking in 2018. Her current medications include albuterol  as needed and Trelegy once daily. Yvette Jackson previously used Breztri but discontinued it due to cost. Yvette Jackson follows up with a pulmonologist every three months, with her next appointment scheduled for December 30, 2023.  Yvette Jackson experiences anxiety, particularly related to driving long distances, and has been on Buspar, Paxil, and Cymbalta for a long time. Yvette Jackson is unsure of the current dose of Cymbalta. Yvette Jackson does not see psychology or psychiatry.   Yvette Jackson takes omeprazole  as needed for acid reflux.   Yvette Jackson has a history of aortic stenosis, HLD is following with cardiology. Her current medications also include metoprolol and Lasix . Yvette Jackson takes rosuvastatin for cholesterol. Her last cardiology visit was in May, and Yvette Jackson is scheduled to follow up in November.  Yvette Jackson has a history of IDA and is currently taking ferrous sulfate daily.   Yvette Jackson takes Allegra for allergies. Yvette Jackson had a mammogram and colonoscopy done recently and is due for a bone density scan. Yvette Jackson has not had a lung scan this year.   Outpatient Encounter Medications as of 12/23/2023  Medication Sig   albuterol  (PROVENTIL  HFA;VENTOLIN  HFA) 108 (90 Base) MCG/ACT inhaler Inhale 2 puffs into the lungs every 6 (six) hours as needed for wheezing or shortness of breath.    aspirin  EC 81 MG tablet Take 81 mg by mouth daily. As needed   budesonide -glycopyrrolate -formoterol (BREZTRI AEROSPHERE) 160-9-4.8 MCG/ACT AERO inhaler Inhale 2 puffs into the lungs 2 (two) times daily.   busPIRone (BUSPAR) 7.5 MG tablet Take 7.5 mg by mouth 3 (three) times daily.   ferrous sulfate 325 (65 FE) MG EC tablet Take 325 mg by mouth 3 (three) times daily with meals.   fexofenadine (ALLEGRA) 180 MG tablet Take 180 mg by mouth daily as needed for allergies or rhinitis.   furosemide  (LASIX ) 20 MG tablet Take 20 mg by mouth.   metoprolol succinate (TOPROL-XL) 25 MG 24 hr tablet Take 25 mg by mouth daily.   omeprazole  (PRILOSEC  OTC) 20 MG tablet Take 1 tablet (20 mg total) by mouth daily.   omeprazole  (PRILOSEC ) 40 MG capsule Take 40 mg by mouth daily.   PARoxetine (PAXIL) 10 MG tablet Take 20 mg by mouth daily.   rosuvastatin (CRESTOR) 10 MG tablet Take 10 mg by mouth daily.   DULoxetine (CYMBALTA) 60 MG capsule Take 1 capsule by mouth daily.   [DISCONTINUED] budesonide -formoterol (SYMBICORT) 160-4.5 MCG/ACT inhaler Inhale 2 puffs into the lungs daily.  (Patient not taking: Reported on 10/27/2023)   [DISCONTINUED] Estradiol  10 MCG TABS vaginal tablet Place 1 tablet (10 mcg total) vaginally 2 (two) times a week. Use nightly for 2 weeks, then decrease to twice a week. (Patient not taking: Reported on 10/27/2023)   [DISCONTINUED] meclizine (ANTIVERT) 25 MG tablet TAKE 1 TABLET  BY MOUTH EVERY 8 HOURS AS NEEDED FOR DIZZINESS.   No facility-administered encounter medications on file as of 12/23/2023.    Past Medical History:  Diagnosis Date   Allergy    Anemia    Anxiety    Blood transfusion without reported diagnosis    Bronchitis    Cataract    COPD (chronic obstructive pulmonary disease) (HCC)    Depression    Difficult intubation    Dyspnea    with exertion   Family history of adverse reaction to anesthesia    Sister died from anesthesia many years ago during thyroid surgery   Heart murmur     History of hiatal hernia    Hypertension    Motion sickness    cars   Nonrheumatic aortic (valve) stenosis    Panic attacks     Past Surgical History:  Procedure Laterality Date   CESAREAN SECTION  1982   COLONOSCOPY N/A 10/27/2023   Procedure: COLONOSCOPY;  Surgeon: Toledo, Ladell POUR, MD;  Location: [facility] END[facility];  Service: Gastroenterology;  Laterality: N/A;   COLONOSCOPY WITH PROPOFOL  N/A 08/26/2017   Procedure: COLONOSCOPY WITH PROPOFOL ;  Surgeon: Toledo, Ladell POUR, MD;  Location: [facility] ENDOSCOPY;  Service: Gastroenterology;  Laterality: N/A;   ESOPHAGOGASTRODUODENOSCOPY N/A 10/27/2023   Procedure: EGD (ESOPHAGOGASTRODUODENOSCOPY);  Surgeon: Toledo, Ladell POUR, MD;  Location: [facility] END[facility]COPY;  Service: Gastroenterology;  Laterality: N/A;   ESOPHAGOGASTRODUODENOSCOPY (EGD) WITH PROPOFOL  N/A 09/18/2016   Procedure: ESOPHAGOGASTRODUODENOSCOPY (EGD) WITH PROPOFOL ;  Surgeon: Jinny Carmine, MD;  Location: [facility];  Service: Endoscopy;  Laterality: N/A;   ESOPHAGOGASTRODUODENOSCOPY (EGD) WITH PROPOFOL  N/A 08/26/2017   Procedure: ESOPHAGOGASTRODUODENOSCOPY (EGD) WITH PROPOFOL ;  Surgeon: Toledo, Ladell POUR, MD;  Location: [facility] END[facility]COPY;  Service: Gastroenterology;  Laterality: N/A;   EYE SURGERY     HERNIA REPAIR     HIATAL HERNIA REPAIR N/A 10/21/2016   Procedure: LAPAROSCOPIC REPAIR OF HIATAL HERNIA;  Surgeon: Jordis Laneta FALCON, MD;  Location: [facility] OR[facility];  Service: General;  Laterality: N/A;   HIATAL HERNIA REPAIR N/A 10/21/2016   Procedure: HERNIA REPAIR HIATAL;  Surgeon: Jordis Laneta FALCON, MD;  Location: [facility];  Service: General;  Laterality: N/A;   POLYPECTOMY  10/27/2023   Procedure: POLYPECTOMY, INTESTINE;  Surgeon: Toledo, Ladell POUR, MD;  Location: [facility];  Service: Gastroenterology;;   TONSILLECTOMY      Family History  Problem Relation Age of Onset   Diabetes Mother    Heart disease Mother    COPD Father    Breast cancer Neg Hx    Ovarian cancer Neg Hx     Colon cancer Neg Hx     Social History   Socioeconomic History   Marital status: Single    Spouse name: Not on file   Number of children: Not on file   Years of education: Not on file   Highest education level: Associate degree: academic program  Occupational History   Not on file  Tobacco Use   Smoking status: Former    Current packs/day: 0.00    Average packs/day: 1 pack/day for 40.0 years (40.0 ttl pk-yrs)    Types: Cigarettes    Start date: 05/06/1976    Quit date: 05/06/2016    Years since quitting: 7.6   Smokeless tobacco: Never  Vaping Use   Vaping status: Never Used  Substance and Sexual Activity   Alcohol use: Not Currently    Alcohol/week: 1.0 standard drink of alcohol    Types: 1 Glasses of wine per week  Drug use: No   Sexual activity: Not Currently    Birth control/protection: Abstinence, Post-menopausal  Other Topics Concern   Not on file  Social History Narrative   Not on file   Social Drivers of Health   Financial Resource Strain: Low Risk  (12/22/2023)   Overall Financial Resource Strain (CARDIA)    Difficulty of Paying Living Expenses: Not very hard  Food Insecurity: No Food Insecurity (12/22/2023)   Hunger Vital Sign    Worried About Running Out of Food in the Last Year: Never true    Ran Out of Food in the Last Year: Never true  Transportation Needs: No Transportation Needs (12/22/2023)   PRAPARE - Administrator, Civil Service (Medical): No    Lack of Transportation (Non-Medical): No  Physical Activity: Insufficiently Active (12/22/2023)   Exercise Vital Sign    Days of Exercise per Week: 2 days    Minutes of Exercise per Session: 10 min  Stress: No Stress Concern Present (12/22/2023)   [employer] of Occupational Health - Occupational Stress Questionnaire    Feeling of Stress: Only a little  Social Connections: Unknown (12/22/2023)   Social Connection and Isolation Panel    Frequency of Communication with Friends and Family: Once a  week    Frequency of Social Gatherings with Friends and Family: Not on file    Attends Religious Services: More than 4 times per year    Active Member of [employer] or Organizations: No    Attends Engineer, structural: Not on file    Marital Status: Divorced  Intimate Partner Violence: Not on file    Review of Systems  Constitutional:  Negative for chills and fever.  Respiratory:  Negative for shortness of breath.   Cardiovascular:  Negative for chest pain.  Gastrointestinal:  Negative for abdominal pain, constipation, diarrhea, heartburn, nausea and vomiting.  Genitourinary:  Negative for dysuria, frequency and urgency.  Neurological:  Negative for dizziness and headaches.  Endo/Heme/Allergies:  Negative for polydipsia.  Psychiatric/Behavioral:  Negative for depression and suicidal ideas. The patient is not nervous/anxious.         Objective    BP 126/68   Pulse 63   Temp 98.3 F (36.8 C) (Temporal)   Ht 5' 1.5 (1.562 m)   Wt 198 lb 8 oz (90 kg)   SpO2 95%   BMI 36.90 kg/m   Physical Exam Vitals and nursing note reviewed.  Constitutional:      Appearance: Normal appearance.  Cardiovascular:     Rate and Rhythm: Normal rate and regular rhythm.     Pulses: Normal pulses.     Heart sounds: Normal heart sounds.  Pulmonary:     Effort: Pulmonary effort is normal.     Breath sounds: Normal breath sounds.  Neurological:     Mental Status: Yvette Jackson is alert and oriented to person, place, and time.  Psychiatric:        Mood and Affect: Mood normal.        Behavior: Behavior normal.        Thought Content: Thought content normal.        Judgment: Judgment normal.         Assessment & Plan:  Chronic bronchitis, unspecified chronic bronchitis type (HCC) Assessment & Plan: Quit smoking In 2018   Establishing care with new doctor, encounter for Assessment & Plan: EMR reviewed briefly.     Need for influenza vaccination -     Flu vaccine HIGH  DOSE PF(Fluzone  Trivalent)  Obesity, morbid (HCC)  Encounter for screening mammogram for malignant neoplasm of breast  Postmenopausal -     3D Screening Mammogram, Left and Right; Future -     DG Bone Density; Future  Aortic valve stenosis, etiology of cardiac valve disease unspecified  Hyperlipidemia, unspecified hyperlipidemia type  Asthma, unspecified asthma severity, unspecified whether complicated, unspecified whether persistent  Moderate COPD (chronic obstructive pulmonary disease) (HCC)  OSA (obstructive sleep apnea)  Coronary artery disease, unspecified vessel or lesion type, unspecified whether angina present, unspecified whether native or transplanted heart  Heart murmur  Gastroesophageal reflux disease, unspecified whether esophagitis present  Class 2 severe obesity due to excess calories with serious comorbidity and body mass index (BMI) of 36.0 to 36.9 in adult [facility])    Assessment and Plan Assessment & Plan Chronic obstructive pulmonary disease (COPD) Previously a smoker, quit in 2018. Currently using Trelegy daily and albuterol  as needed. Breztri was discontinued due to cost. Follow-up with pulmonologist scheduled for September 10th. - Continue Trelegy daily. - Use albuterol  as needed. - Reviewed pulmonology notes from June, 2025. - Follow up with pulmonologist on September 10th. - Confirm Trelegy dose at next visit.  Aortic valve stenosis, HLD and heart murmur Well-managed with metoprolol, rosuvastatin, and Lasix . Follow-up with cardiologist scheduled for November. - Continue metoprolol, rosuvastatin, and Lasix  as prescribed. - Reviewed cardiology notes from May. - Follow up with cardiologist in November.  Anemia Managed with iron supplementation. - Continue iron supplementation.  Hypertension Managed with metoprolol. - Continue metoprolol as prescribed.  Hyperlipidemia Managed with rosuvastatin. - Continue rosuvastatin as prescribed.  Anxiety disorder Managed  with Buspar, Paxil, and Cymbalta. Anxiety related to driving long distances. Need to confirm Cymbalta dose. - Continue Buspar, Paxil, and Cymbalta. - Yvette Jackson will call back with the correct Cymbalta dose.  Gastroesophageal reflux disease (GERD) Managed with omeprazole  as needed. - Continue omeprazole  as needed.  Allergic rhinitis Managed with Allegra. - Continue Allegra as prescribed.   Return in about 3 months (around 04/02/2024) for chronic care management.SABRA Carrol Aurora, NP

## 2023-12-23 NOTE — Assessment & Plan Note (Signed)
 Quit smoking In 2018

## 2023-12-23 NOTE — Patient Instructions (Addendum)
 Call my office and let us  know the dose of the Trelegy inhaler and the Cymbalta.   Call and schedule the mammogram and bone density scan.   Follow up in 3 months for labs and office visit.   It was a pleasure to meet you today! Please don't hesitate to contact me with any questions. Welcome to [facility]!

## 2023-12-25 ENCOUNTER — Telehealth: Payer: Self-pay

## 2023-12-25 NOTE — Telephone Encounter (Signed)
 Copied from CRM [id number]. Topic: Clinical - Medication Question >> Dec 25, 2023  4:25 PM Thersia C wrote: Reason for CRM: Patient called in regarding sending letter of medications to NP Carrol Aurora  busPIRone (BUSPAR) 7.5 MG tablet furosemide  (LASIX ) 20 MG tablet PARoxetine (PAXIL) 10 MG tablet - take 20 MG  Trelegy  metoprolol succinate (TOPROL-XL) 25 MG 24 hr tablet rosuvastatin (CRESTOR) 10 MG tablet omeprazole  (PRILOSEC ) 40 MG capsule aspirin EC 81 MG tablet

## 2023-12-29 NOTE — Addendum Note (Signed)
 Addended by: TENNIE RAISIN B on: 12/29/2023 04:14 PM   Modules accepted: Orders

## 2023-12-29 NOTE — Telephone Encounter (Signed)
 Medications have been updated in EMR

## 2024-01-15 ENCOUNTER — Other Ambulatory Visit: Payer: Self-pay | Admitting: General Practice

## 2024-01-15 DIAGNOSIS — J42 Unspecified chronic bronchitis: Secondary | ICD-10-CM

## 2024-01-15 NOTE — Telephone Encounter (Signed)
 Copied from CRM [id number]. Topic: Clinical - Medication Refill >> Jan 15, 2024  4:49 PM Chiquita SQUIBB wrote: Medication:  busPIRone busPIRone (BUSPAR) 7.5 MG tablet  furosemide  (LASIX ) 20 MG tablet  Trelegy Ellipta TRELEGY ELLIPTA 100-62.5-25 MCG/ACT AEPB  PARoxetine PARoxetine (PAXIL) 10 MG tablet  metoprolol succinate metoprolol succinate (TOPROL-XL) 25 MG 24 hr tablet  rosuvastatin rosuvastatin (CRESTOR) 10 MG tablet  omeprazole  omeprazole  (PRILOSEC ) 40 MG capsule  aspirin EC aspirin EC 81 MG tablet  Has the patient contacted their pharmacy? Yes (Agent: If no, request that the patient contact the pharmacy for the refill. If patient does not wish to contact the pharmacy document the reason why and proceed with request.) (Agent: If yes, when and what did the pharmacy advise?)  This is the patient's preferred pharmacy:  [facility] [phone] [facility], [place] - [address] [place] [address] Phone: [phone] Fax: [phone]  Is this the correct pharmacy for this prescription? Yes If no, delete pharmacy and type the correct one.   Has the prescription been filled recently? No  Is the patient out of the medication? Yes  Has the patient been seen for an appointment in the last year OR does the patient have an upcoming appointment? Yes  Can we respond through MyChart? Yes  Agent: Please be advised that Rx refills may take up to 3 business days. We ask that you follow-up with your pharmacy.

## 2024-01-19 MED ORDER — PAROXETINE HCL 10 MG PO TABS: 20.0000 mg | ORAL_TABLET | Freq: Every day | ORAL | 2 refills | Status: AC

## 2024-01-19 MED ORDER — OMEPRAZOLE 40 MG PO CPDR: 40.0000 mg | DELAYED_RELEASE_CAPSULE | Freq: Every day | ORAL | 2 refills | Status: DC

## 2024-01-19 MED ORDER — ASPIRIN EC 81 MG PO TBEC: 81.0000 mg | DELAYED_RELEASE_TABLET | Freq: Every day | ORAL | 1 refills | Status: DC

## 2024-01-19 MED ORDER — ROSUVASTATIN CALCIUM 10 MG PO TABS: 10.0000 mg | ORAL_TABLET | Freq: Every day | ORAL | 3 refills | Status: AC

## 2024-01-19 MED ORDER — FUROSEMIDE 20 MG PO TABS: 20.0000 mg | ORAL_TABLET | Freq: Every day | ORAL | 2 refills | Status: AC

## 2024-01-19 MED ORDER — METOPROLOL SUCCINATE ER 25 MG PO TB24: 25.0000 mg | ORAL_TABLET | Freq: Every day | ORAL | 2 refills | Status: DC

## 2024-01-19 MED ORDER — BUSPIRONE HCL 7.5 MG PO TABS: 7.5000 mg | ORAL_TABLET | Freq: Three times a day (TID) | ORAL | 2 refills | Status: DC

## 2024-01-20 ENCOUNTER — Other Ambulatory Visit: Payer: Self-pay | Admitting: General Practice

## 2024-01-20 DIAGNOSIS — J42 Unspecified chronic bronchitis: Secondary | ICD-10-CM

## 2024-01-20 MED ORDER — TRELEGY ELLIPTA 100-62.5-25 MCG/ACT IN AEPB: 1.0000 | INHALATION_SPRAY | Freq: Every day | RESPIRATORY_TRACT | 1 refills | Status: DC

## 2024-02-11 ENCOUNTER — Other Ambulatory Visit: Payer: Self-pay | Admitting: General Practice

## 2024-03-01 DIAGNOSIS — I2510 Atherosclerotic heart disease of native coronary artery without angina pectoris: Secondary | ICD-10-CM | POA: Diagnosis not present

## 2024-03-01 DIAGNOSIS — I351 Nonrheumatic aortic (valve) insufficiency: Secondary | ICD-10-CM | POA: Diagnosis not present

## 2024-03-01 DIAGNOSIS — G4733 Obstructive sleep apnea (adult) (pediatric): Secondary | ICD-10-CM | POA: Diagnosis not present

## 2024-03-01 DIAGNOSIS — R011 Cardiac murmur, unspecified: Secondary | ICD-10-CM | POA: Diagnosis not present

## 2024-03-01 DIAGNOSIS — J449 Chronic obstructive pulmonary disease, unspecified: Secondary | ICD-10-CM | POA: Diagnosis not present

## 2024-03-01 DIAGNOSIS — I350 Nonrheumatic aortic (valve) stenosis: Secondary | ICD-10-CM | POA: Diagnosis not present

## 2024-03-01 DIAGNOSIS — E785 Hyperlipidemia, unspecified: Secondary | ICD-10-CM | POA: Diagnosis not present

## 2024-03-01 DIAGNOSIS — E669 Obesity, unspecified: Secondary | ICD-10-CM | POA: Diagnosis not present

## 2024-03-01 DIAGNOSIS — R0602 Shortness of breath: Secondary | ICD-10-CM | POA: Diagnosis not present

## 2024-03-14 DIAGNOSIS — H33312 Horseshoe tear of retina without detachment, left eye: Secondary | ICD-10-CM | POA: Diagnosis not present

## 2024-03-14 DIAGNOSIS — H26493 Other secondary cataract, bilateral: Secondary | ICD-10-CM | POA: Diagnosis not present

## 2024-03-14 DIAGNOSIS — H5213 Myopia, bilateral: Secondary | ICD-10-CM | POA: Diagnosis not present

## 2024-03-14 DIAGNOSIS — H1045 Other chronic allergic conjunctivitis: Secondary | ICD-10-CM | POA: Diagnosis not present

## 2024-03-15 DIAGNOSIS — Z961 Presence of intraocular lens: Secondary | ICD-10-CM | POA: Diagnosis not present

## 2024-03-15 DIAGNOSIS — H43813 Vitreous degeneration, bilateral: Secondary | ICD-10-CM | POA: Diagnosis not present

## 2024-03-15 DIAGNOSIS — H33312 Horseshoe tear of retina without detachment, left eye: Secondary | ICD-10-CM | POA: Diagnosis not present

## 2024-03-23 ENCOUNTER — Ambulatory Visit: Admitting: General Practice

## 2024-03-29 ENCOUNTER — Encounter: Payer: Self-pay | Admitting: General Practice

## 2024-03-29 ENCOUNTER — Ambulatory Visit: Admitting: General Practice

## 2024-03-29 VITALS — BP 120/72 | HR 65 | Temp 98.4°F | Ht 61.5 in | Wt 197.0 lb

## 2024-03-29 DIAGNOSIS — J309 Allergic rhinitis, unspecified: Secondary | ICD-10-CM | POA: Insufficient documentation

## 2024-03-29 DIAGNOSIS — N189 Chronic kidney disease, unspecified: Secondary | ICD-10-CM | POA: Insufficient documentation

## 2024-03-29 DIAGNOSIS — J3089 Other allergic rhinitis: Secondary | ICD-10-CM

## 2024-03-29 DIAGNOSIS — E66812 Obesity, class 2: Secondary | ICD-10-CM | POA: Insufficient documentation

## 2024-03-29 DIAGNOSIS — E785 Hyperlipidemia, unspecified: Secondary | ICD-10-CM | POA: Insufficient documentation

## 2024-03-29 DIAGNOSIS — J45909 Unspecified asthma, uncomplicated: Secondary | ICD-10-CM | POA: Insufficient documentation

## 2024-03-29 DIAGNOSIS — I2510 Atherosclerotic heart disease of native coronary artery without angina pectoris: Secondary | ICD-10-CM | POA: Insufficient documentation

## 2024-03-29 DIAGNOSIS — J42 Unspecified chronic bronchitis: Secondary | ICD-10-CM

## 2024-03-29 DIAGNOSIS — G4733 Obstructive sleep apnea (adult) (pediatric): Secondary | ICD-10-CM | POA: Insufficient documentation

## 2024-03-29 DIAGNOSIS — F419 Anxiety disorder, unspecified: Secondary | ICD-10-CM

## 2024-03-29 DIAGNOSIS — K219 Gastro-esophageal reflux disease without esophagitis: Secondary | ICD-10-CM | POA: Insufficient documentation

## 2024-03-29 NOTE — Patient Instructions (Addendum)
 Stop by the lab prior to leaving today. I will notify you of your results once received.   Call and schedule mammogram and bone density scan.   Schedule medicare wellness visit with nurse.   Follow up in 3 months for physical and labs.   It was a pleasure to see you today!

## 2024-03-29 NOTE — Progress Notes (Signed)
 Established Patient Office Visit  Subjective   Patient ID: Yvette Jackson, female    DOB: Sep 03, 1950  Age: 73 y.o. MRN: [id number]  Chief Complaint  Patient presents with   chronic care management    Patient here today to follow up on chronic conditions    Cyst    Patient has had cyst on her back for years     HPI  Yvette Jackson is a 73 year old female with past medical history of anxiety, HTN, anemia, HLD, GERD, chronic bronchitis, obesity presents today for chronic care management.   Discussed the use of AI scribe software for clinical note transcription with the patient, who gave verbal consent to proceed.  History of Present Illness Yvette Jackson is a 73 year old female who presents for a follow-up visit.  She is no longer taking Trelegy due to its high cost and is currently using samples provided by her pulmonologist. She has a history of using CPAP for sleep apnea but has discontinued it due to expense, and she feels 'okay' despite not using the CPAP.  She recently saw her cardiologist, who recommended an echocardiogram in five months and a follow-up visit in six months. Her current cardiac medications include rosuvastatin  10 mg daily, Lasix  (furosemide ) 20 mg once daily, and metoprolol  25 mg once daily. No changes in her cardiac medication regimen.  She has a history of low kidney function, with a recent GFR test showing slightly low results. No history of diabetes and has not had an A1c test recently.  She quit smoking after a hernia surgery and has not smoked since. She takes Claritin as needed and meclizine, which she describes as 'old'. Her current medications also include Buspar  7.5 mg three times a day, Prilosec  once daily, Paxil  40 mg once a day, and Crestor  10 mg daily.  She mentions a cyst on her back, just below her neck, which was previously lanced. No pain or discomfort from the cyst currently. It was initially thought to be an insect bite.  She thought she had  completed her mammogram and bone density scan but has not received results.    Patient Active Problem List   Diagnosis Date Noted   Hyperlipidemia 03/29/2024   Class 2 severe obesity due to excess calories with serious comorbidity and body mass index (BMI) of 36.0 to 36.9 in adult 03/29/2024   Gastroesophageal reflux disease 03/29/2024   OSA (obstructive sleep apnea) 03/29/2024   Asthma 03/29/2024   Coronary artery disease 03/29/2024   Chronic kidney disease 03/29/2024   Allergic rhinitis 03/29/2024   Obesity, morbid (HCC) 12/23/2023   Establishing care with new doctor, encounter for 12/23/2023   Obesity (BMI 35.0-39.9 without comorbidity) 05/27/2017   Menopause 05/27/2017   Anxiety 10/14/2016   Chronic bronchitis, unspecified chronic bronchitis type (HCC) 11/02/2013   Past Medical History:  Diagnosis Date   Allergy    Anemia    Anxiety    Blood transfusion without reported diagnosis    Bronchitis    Cataract    COPD (chronic obstructive pulmonary disease) (HCC)    Depression    Difficult intubation    Dyspnea    with exertion   Family history of adverse reaction to anesthesia    Sister died from anesthesia many years ago during thyroid surgery   Heart murmur    History of hiatal hernia    Hypertension    Motion sickness    cars   Nonrheumatic aortic (valve) stenosis  Panic attacks    Past Surgical History:  Procedure Laterality Date   CESAREAN SECTION  1982   COLONOSCOPY N/A 10/27/2023   Procedure: COLONOSCOPY;  Surgeon: Toledo, Ladell POUR, MD;  Location: [facility];  Service: Gastroenterology;  Laterality: N/A;   COLONOSCOPY WITH PROPOFOL  N/A 08/26/2017   Procedure: COLONOSCOPY WITH PROPOFOL ;  Surgeon: Toledo, Ladell POUR, MD;  Location: [facility];  Service: Gastroenterology;  Laterality: N/A;   ESOPHAGOGASTRODUODENOSCOPY N/A 10/27/2023   Procedure: EGD (ESOPHAGOGASTRODUODENOSCOPY);  Surgeon: Toledo, Ladell POUR, MD;  Location: [facility];  Service:  Gastroenterology;  Laterality: N/A;   ESOPHAGOGASTRODUODENOSCOPY (EGD) WITH PROPOFOL  N/A 09/18/2016   Procedure: ESOPHAGOGASTRODUODENOSCOPY (EGD) WITH PROPOFOL ;  Surgeon: Jinny Carmine, MD;  Location: [facility];  Service: Endoscopy;  Laterality: N/A;   ESOPHAGOGASTRODUODENOSCOPY (EGD) WITH PROPOFOL  N/A 08/26/2017   Procedure: ESOPHAGOGASTRODUODENOSCOPY (EGD) WITH PROPOFOL ;  Surgeon: Toledo, Ladell POUR, MD;  Location: [facility];  Service: Gastroenterology;  Laterality: N/A;   EYE SURGERY     HERNIA REPAIR     HIATAL HERNIA REPAIR N/A 10/21/2016   Procedure: LAPAROSCOPIC REPAIR OF HIATAL HERNIA;  Surgeon: Jordis Laneta FALCON, MD;  Location: [facility];  Service: General;  Laterality: N/A;   HIATAL HERNIA REPAIR N/A 10/21/2016   Procedure: HERNIA REPAIR HIATAL;  Surgeon: Jordis Laneta FALCON, MD;  Location: [facility];  Service: General;  Laterality: N/A;   POLYPECTOMY  10/27/2023   Procedure: POLYPECTOMY, INTESTINE;  Surgeon: Toledo, Teodoro K, MD;  Location: [facility];  Service: Gastroenterology;;   TONSILLECTOMY     Allergies  Allergen Reactions   Acetaminophen  Hives    Pt takes Advil at home for pain   Sulfasalazine Swelling    Per pt takes Advil at home.    Penicillin G Other (See Comments)         03/29/2024    1:59 PM 12/23/2023    2:35 PM  Depression screen PHQ 2/9  Decreased Interest 0 1  Down, Depressed, Hopeless 0 1  PHQ - 2 Score 0 2  Altered sleeping 0 1  Tired, decreased energy 0 2  Change in appetite 0 0  Feeling bad or failure about yourself  0 0  Trouble concentrating 0 0  Moving slowly or fidgety/restless 0 0  Suicidal thoughts 0 0  PHQ-9 Score 0 5   Difficult doing work/chores Not difficult at all Somewhat difficult     Data saved with a previous flowsheet row definition       03/29/2024    1:59 PM 12/23/2023    2:35 PM  GAD 7 : Generalized Anxiety Score  Nervous, Anxious, on Edge 0 0  Control/stop worrying 0 0  Worry too much - different things 0 0   Trouble relaxing 0 0  Restless 0 0  Easily annoyed or irritable 0 0  Afraid - awful might happen 0 0  Total GAD 7 Score 0 0  Anxiety Difficulty Not difficult at all       Review of Systems  Constitutional:  Negative for chills and fever.  Respiratory:  Negative for shortness of breath.   Cardiovascular:  Negative for chest pain.  Gastrointestinal:  Negative for abdominal pain, constipation, diarrhea, heartburn, nausea and vomiting.  Genitourinary:  Negative for dysuria, frequency and urgency.  Neurological:  Negative for dizziness and headaches.  Endo/Heme/Allergies:  Negative for polydipsia.  Psychiatric/Behavioral:  Negative for depression and suicidal ideas. The patient is not nervous/anxious.       Objective:     BP 120/72  Pulse 65   Temp 98.4 F (36.9 C) (Oral)   Ht 5' 1.5 (1.562 m)   Wt 197 lb (89.4 kg)   SpO2 97%   BMI 36.62 kg/m  BP Readings from Last 3 Encounters:  03/29/24 120/72  12/23/23 126/68  10/27/23 (!) 157/80   Wt Readings from Last 3 Encounters:  03/29/24 197 lb (89.4 kg)  12/23/23 198 lb 8 oz (90 kg)  10/27/23 205 lb 12.8 oz (93.4 kg)      Physical Exam Vitals and nursing note reviewed.  Constitutional:      Appearance: Normal appearance.  Cardiovascular:     Rate and Rhythm: Normal rate and regular rhythm.     Pulses: Normal pulses.     Heart sounds: Normal heart sounds.  Pulmonary:     Effort: Pulmonary effort is normal.     Breath sounds: Normal breath sounds.  Neurological:     Mental Status: She is alert and oriented to person, place, and time.  Psychiatric:        Mood and Affect: Mood normal.        Behavior: Behavior normal.        Thought Content: Thought content normal.        Judgment: Judgment normal.      No results found for any visits on 03/29/24.     The ASCVD Risk score (Arnett DK, et al., 2019) failed to calculate for the following reasons:   Cannot find a previous HDL lab   Cannot find a previous  total cholesterol lab    Assessment & Plan:  Hyperlipidemia, unspecified hyperlipidemia type -     Hemoglobin A1c -     Comprehensive metabolic panel with GFR  Class 2 severe obesity due to excess calories with serious comorbidity and body mass index (BMI) of 36.0 to 36.9 in adult  Gastroesophageal reflux disease, unspecified whether esophagitis present  Anxiety  Chronic bronchitis, unspecified chronic bronchitis type (HCC)  OSA (obstructive sleep apnea)  Asthma, unspecified asthma severity, unspecified whether complicated, unspecified whether persistent  Coronary artery disease, unspecified vessel or lesion type, unspecified whether angina present, unspecified whether native or transplanted heart  Allergic rhinitis due to other allergic trigger, unspecified seasonality  Chronic kidney disease, unspecified CKD stage    Assessment and Plan Assessment & Plan Skin cyst of upper back Asymptomatic cyst previously drained.  - on red flags.  - declines referral to surgeon. - Refer to surgeon for evaluation and potential excision if symptomatic or size increases.  Atherosclerotic heart disease Managed with rosuvastatin , furosemide , and metoprolol . Recent cardiology visit with no changes. Echocardiogram scheduled. - reviewed cardiology notes and labs. - Continue rosuvastatin  10 mg daily. - Continue furosemide  20 mg daily. - Continue metoprolol  25 mg daily. - Schedule echocardiogram in five months. - Follow up with cardiology in six months.  Obstructive sleep apnea Non-compliance with CPAP due to cost. Awaiting insurance changes. - Monitor for symptoms.   Chronic kidney disease Low GFR, no diabetes. Recent labs showed low kidney function. - Rechecked kidney function with labs today.  Hyperlipidemia Managed with rosuvastatin . Recent cholesterol check by cardiologist. - Continue rosuvastatin  10 mg daily.  Gastroesophageal reflux disease Managed with omeprazole . -  Continue omeprazole  40 mg daily.  Anxiety disorder Managed with buspirone  and paroxetine . - Continue buspirone  7.5 mg three times daily. - Continue paroxetine  40 mg daily.  Allergic rhinitis Managed with fexofenadine as needed. - Continue fexofenadine 180 mg as needed.  General Health Maintenance Due for mammogram,  bone density scan, tetanus shot, and Medicare wellness visit. - Order mammogram and bone density scan. - Provide phone number for scheduling mammogram and bone density scan. - Advise tetanus shot at pharmacy. - Schedule Medicare wellness visit with nurse.   Return in about 3 months (around 06/27/2024) for physical and fasting labs.SABRA Carrol Aurora, NP

## 2024-03-30 ENCOUNTER — Ambulatory Visit: Payer: Self-pay | Admitting: General Practice

## 2024-03-30 LAB — COMPREHENSIVE METABOLIC PANEL WITH GFR
ALT: 9 U/L (ref 0–35)
AST: 19 U/L (ref 0–37)
Albumin: 4 g/dL (ref 3.5–5.2)
Alkaline Phosphatase: 58 U/L (ref 39–117)
BUN: 13 mg/dL (ref 6–23)
CO2: 32 meq/L (ref 19–32)
Calcium: 9.5 mg/dL (ref 8.4–10.5)
Chloride: 103 meq/L (ref 96–112)
Creatinine, Ser: 1.2 mg/dL (ref 0.40–1.20)
GFR: 45.26 mL/min — ABNORMAL LOW (ref >60.00)
Glucose, Bld: 96 mg/dL (ref 70–99)
Potassium: 3.9 meq/L (ref 3.5–5.1)
Sodium: 141 meq/L (ref 135–145)
Total Bilirubin: 0.4 mg/dL (ref 0.2–1.2)
Total Protein: 8.2 g/dL (ref 6.0–8.3)

## 2024-03-30 LAB — HEMOGLOBIN A1C: Hgb A1c MFr Bld: 5.7 % (ref 4.6–6.5)

## 2024-04-12 DIAGNOSIS — H43813 Vitreous degeneration, bilateral: Secondary | ICD-10-CM | POA: Diagnosis not present

## 2024-04-12 DIAGNOSIS — H33312 Horseshoe tear of retina without detachment, left eye: Secondary | ICD-10-CM | POA: Diagnosis not present

## 2024-04-12 DIAGNOSIS — Z961 Presence of intraocular lens: Secondary | ICD-10-CM | POA: Diagnosis not present

## 2024-04-15 ENCOUNTER — Other Ambulatory Visit: Payer: Self-pay | Admitting: General Practice

## 2024-04-18 ENCOUNTER — Other Ambulatory Visit: Payer: Self-pay | Admitting: General Practice

## 2024-04-25 ENCOUNTER — Ambulatory Visit: Payer: Self-pay

## 2024-04-25 NOTE — Telephone Encounter (Signed)
 FYI Only or Action Required?: FYI only for provider: appointment scheduled on 04/26/24.  Patient was last seen in primary care on 03/29/2024 by [name], NP.  Called Nurse Triage reporting Cough.  Symptoms began several days ago.  Interventions attempted: Rest, hydration, or home remedies.  Symptoms are: unchanged.  Triage Disposition: See HCP Within 4 Hours (Or PCP Triage)  Patient/caregiver understands and will follow disposition?: Yes  Reason for Disposition  Wheezing is present  Answer Assessment - Initial Assessment Questions Pt reports congestion, wheezing and possible fever (states resolved) and nocturnal productive cough with green sputum. Symptoms began approx 04/19/24. Is drinking warm water  with honey. States symptoms are improving, but wheezing continues intermittently.  1. ONSET: When did the cough begin?      04/19/24 3. SPUTUM: Describe the color of your sputum (e.g., none, dry cough; clear, white, yellow, green)     Green 5. DIFFICULTY BREATHING: Are you having difficulty breathing? If Yes, ask: How bad is it? (e.g., mild, moderate, severe)      Denies SOB above her baseline 6. FEVER: Do you have a fever? If Yes, ask: What is your temperature, how was it measured, and when did it start?     States felt feverish (did not check temp), has resolved 7. CARDIAC HISTORY: Do you have any history of heart disease? (e.g., heart attack, congestive heart failure)      Murmur 8. LUNG HISTORY: Do you have any history of lung disease?  (e.g., pulmonary embolus, asthma, emphysema)     COPD listed (pt states she is not aware), former smoker  Protocols used: Cough - Acute Productive-A-AH Message from [place] sent at 04/25/2024 11:20 AM EST  Summary: Congestion   Reason for Triage: Pt needs advise for congestion, headache, body aches, started on 04/21/2024.Please call pt at [phone].

## 2024-04-25 NOTE — Telephone Encounter (Signed)
 Noted; appreciate Matt's evaluation.   -Carrol Aurora, DNP, AGNP-C 04/25/2024 3:28 PM

## 2024-04-26 ENCOUNTER — Ambulatory Visit: Admitting: Nurse Practitioner

## 2024-04-26 ENCOUNTER — Encounter: Payer: Self-pay | Admitting: Nurse Practitioner

## 2024-04-26 VITALS — BP 106/72 | HR 63 | Temp 98.5°F | Ht 61.5 in | Wt 199.0 lb

## 2024-04-26 DIAGNOSIS — R52 Pain, unspecified: Secondary | ICD-10-CM | POA: Diagnosis not present

## 2024-04-26 DIAGNOSIS — Z87891 Personal history of nicotine dependence: Secondary | ICD-10-CM | POA: Diagnosis not present

## 2024-04-26 DIAGNOSIS — J029 Acute pharyngitis, unspecified: Secondary | ICD-10-CM

## 2024-04-26 DIAGNOSIS — J020 Streptococcal pharyngitis: Secondary | ICD-10-CM | POA: Diagnosis not present

## 2024-04-26 DIAGNOSIS — R051 Acute cough: Secondary | ICD-10-CM | POA: Diagnosis not present

## 2024-04-26 DIAGNOSIS — R0609 Other forms of dyspnea: Secondary | ICD-10-CM

## 2024-04-26 DIAGNOSIS — K219 Gastro-esophageal reflux disease without esophagitis: Secondary | ICD-10-CM | POA: Diagnosis not present

## 2024-04-26 LAB — POCT FLU A/B STATUS
Influenza A, POC: NEGATIVE
Influenza B, POC: NEGATIVE

## 2024-04-26 LAB — POC COVID19 BINAXNOW: SARS Coronavirus 2 Ag: NEGATIVE

## 2024-04-26 LAB — POCT RAPID STREP A (OFFICE): Rapid Strep A Screen: POSITIVE — AB

## 2024-04-26 MED ORDER — AZITHROMYCIN 250 MG PO TABS: ORAL_TABLET | ORAL | 0 refills | Status: AC

## 2024-04-26 MED ORDER — PREDNISONE 20 MG PO TABS: 40.0000 mg | ORAL_TABLET | Freq: Every day | ORAL | 0 refills | Status: AC

## 2024-04-26 NOTE — Patient Instructions (Signed)
 Nice to see you today Your covid and flu tests were negative in office Your strep test was positive I have sent over some antibiotics and steroids for you to take. Take the steroids (prednisone ) with food and in the morning time Follow up if you do not improve

## 2024-04-26 NOTE — Progress Notes (Signed)
 "  Established Patient Office Visit  Subjective   Patient ID: Yvette Jackson, female    DOB: Oct 22, 1950  Age: 74 y.o. MRN: [id number]  Chief Complaint  Patient presents with   Cough    Pt complains of chills, headache, body aches, cough, sore throat.       With a history of CAD, OSA, chronic bronchitis. CKD, Obesity and a former smoker  Discussed the use of AI scribe software for clinical note transcription with the patient, who gave verbal consent to proceed.  History of Present Illness Yvette Jackson is a 74 year old female who presents with symptoms of a respiratory infection and a positive strep test.  She began feeling unwell around the New Year's holiday, approximately five days ago, with initial symptoms of fever and chills, which have since improved. She experiences persistent fatigue and a sore throat that has resolved. Her right ear feels clogged, though there is no discharge.  She has a nighttime cough producing green sputum and experiences shortness of breath, particularly with exertion. She uses an albuterol  inhaler as needed, approximately once a day, which provides some relief.  She experienced headaches and fever earlier, for which she took Advil, resulting in significant relief. No nausea, vomiting, diarrhea, or abdominal pain. Body aches were present but have improved.  Her immunization history includes the current season's flu vaccine and the original COVID series with boosters. She quit smoking in 2018.  Her current medications include albuterol  inhaler, buspirone , furosemide , metoprolol , omeprazole , paroxetine , and rosuvastatin . She reports an allergy to Tylenol  and states she is not allergic to penicillin or sulfa drugs.     Review of Systems  Constitutional:  Positive for chills (improved), fever (improved) and malaise/fatigue.  HENT:  Positive for ear pain and sore throat (improved). Negative for sinus pain.   Respiratory:  Positive for cough, sputum  production and shortness of breath (doe).   Gastrointestinal:  Negative for abdominal pain, diarrhea, nausea and vomiting.  Musculoskeletal:  Positive for myalgias (improved).  Neurological:  Positive for headaches.      Objective:     BP 106/72   Pulse 63   Temp 98.5 F (36.9 C) (Oral)   Ht 5' 1.5 (1.562 m)   Wt 199 lb (90.3 kg)   SpO2 97%   BMI 36.99 kg/m    Physical Exam Vitals and nursing note reviewed.  Constitutional:      Appearance: Normal appearance.  HENT:     Right Ear: Tympanic membrane, ear canal and external ear normal.     Left Ear: Tympanic membrane, ear canal and external ear normal.     Mouth/Throat:     Mouth: Mucous membranes are moist.     Pharynx: Oropharynx is clear.  Cardiovascular:     Rate and Rhythm: Normal rate and regular rhythm.     Heart sounds: Normal heart sounds.  Pulmonary:     Effort: Pulmonary effort is normal.     Comments: Decreased globally  Lymphadenopathy:     Cervical: No cervical adenopathy.  Neurological:     Mental Status: She is alert.      Results for orders placed or performed in visit on 04/26/24  POC COVID-19 BinaxNow  Result Value Ref Range   SARS Coronavirus 2 Ag Negative Negative  Rapid Strep A  Result Value Ref Range   Rapid Strep A Screen Positive (A) Negative  POCT Flu A & B Status  Result Value Ref Range   Influenza A, POC  Negative Negative   Influenza B, POC Negative Negative      The ASCVD Risk score (Arnett DK, et al., 2019) failed to calculate for the following reasons:   Cannot find a previous HDL lab   Cannot find a previous total cholesterol lab    Assessment & Plan:   Problem List Items Addressed This Visit   None Visit Diagnoses       Former smoker    -  Primary     Acute cough       Relevant Medications   predniSONE  (DELTASONE ) 20 MG tablet   Other Relevant Orders   POC COVID-19 BinaxNow (Completed)   POCT Flu A & B Status (Completed)     Body aches       Relevant Orders    POC COVID-19 BinaxNow (Completed)   POCT Flu A & B Status (Completed)     DOE (dyspnea on exertion)       Relevant Medications   predniSONE  (DELTASONE ) 20 MG tablet   Other Relevant Orders   POC COVID-19 BinaxNow (Completed)   POCT Flu A & B Status (Completed)     Sore throat       Relevant Orders   POC COVID-19 BinaxNow (Completed)   Rapid Strep A (Completed)   POCT Flu A & B Status (Completed)     Strep pharyngitis       Relevant Medications   azithromycin  (ZITHROMAX ) 250 MG tablet     Assessment and Plan Assessment & Plan Streptococcal pharyngitis Positive strep test confirmed. Symptoms improved. penicillin allergy, allergic to Tylenol  and sulfa drugs. - Prescribed azithromycin  (Z-Pak).  Chronic obstructive pulmonary disease with acute exacerbation COPD exacerbation with increased dyspnea and productive cough. Albuterol  provides some relief. Symptoms suggest exacerbation. - Prescribed prednisone . - Continue albuterol  inhaler as needed.  Gastroesophageal reflux disease Heartburn managed with omeprazole . - Continue omeprazole .   Return if symptoms worsen or fail to improve.    Yvette Crandall, NP  "

## 2024-05-05 ENCOUNTER — Ambulatory Visit
Admission: RE | Admit: 2024-05-05 | Discharge: 2024-05-05 | Disposition: A | Discharge status: Home / Self Care | Source: Ambulatory Visit | Attending: General Practice | Admitting: General Practice

## 2024-05-05 ENCOUNTER — Ambulatory Visit: Payer: Self-pay | Admitting: General Practice

## 2024-05-05 ENCOUNTER — Ambulatory Visit
Admission: RE | Admit: 2024-05-05 | Discharge: 2024-05-05 | Disposition: A | Source: Ambulatory Visit | Attending: General Practice | Admitting: General Practice

## 2024-05-05 DIAGNOSIS — M810 Age-related osteoporosis without current pathological fracture: Secondary | ICD-10-CM | POA: Insufficient documentation

## 2024-05-05 DIAGNOSIS — Z780 Asymptomatic menopausal state: Secondary | ICD-10-CM

## 2024-05-05 DIAGNOSIS — Z1231 Encounter for screening mammogram for malignant neoplasm of breast: Secondary | ICD-10-CM | POA: Insufficient documentation

## 2024-05-06 ENCOUNTER — Other Ambulatory Visit: Payer: Self-pay | Admitting: General Practice

## 2024-05-11 ENCOUNTER — Other Ambulatory Visit: Payer: Self-pay | Admitting: General Practice

## 2024-05-25 ENCOUNTER — Other Ambulatory Visit: Payer: Self-pay | Admitting: General Practice

## 2024-06-27 ENCOUNTER — Encounter: Admitting: General Practice
